# Patient Record
Sex: Male | Born: 1970 | Race: White | Hispanic: No | Marital: Single | Smoking: Current every day smoker
Health system: Southern US, Community
[De-identification: ages and names within clinical notes are randomized; demographics above are authoritative.]

## PROBLEM LIST (undated history)

## (undated) DIAGNOSIS — I2510 Atherosclerotic heart disease of native coronary artery without angina pectoris: Secondary | ICD-10-CM

## (undated) DIAGNOSIS — Z720 Tobacco use: Secondary | ICD-10-CM

## (undated) DIAGNOSIS — I10 Essential (primary) hypertension: Secondary | ICD-10-CM

## (undated) DIAGNOSIS — E785 Hyperlipidemia, unspecified: Secondary | ICD-10-CM

## (undated) HISTORY — DX: Essential (primary) hypertension: I10

## (undated) HISTORY — DX: Hyperlipidemia, unspecified: E78.5

## (undated) HISTORY — DX: Tobacco use: Z72.0

## (undated) HISTORY — DX: Atherosclerotic heart disease of native coronary artery without angina pectoris: I25.10

## (undated) HISTORY — PX: APPENDECTOMY: SHX54

---

## 2007-10-04 ENCOUNTER — Emergency Department: Payer: Self-pay | Admitting: Emergency Medicine

## 2011-05-16 ENCOUNTER — Ambulatory Visit: Payer: Self-pay

## 2012-12-05 ENCOUNTER — Ambulatory Visit: Payer: Self-pay | Admitting: Internal Medicine

## 2012-12-05 LAB — URINALYSIS, COMPLETE
Ketone: NEGATIVE
Nitrite: NEGATIVE
Ph: 6 (ref 4.5–8.0)
Specific Gravity: >=1.03 (ref 1.003–1.030)

## 2012-12-07 LAB — URINE CULTURE

## 2013-11-19 ENCOUNTER — Emergency Department: Payer: Self-pay | Admitting: Emergency Medicine

## 2016-10-14 ENCOUNTER — Emergency Department
Admission: EM | Admit: 2016-10-14 | Discharge: 2016-10-14 | Disposition: A | Discharge status: Home / Self Care | Payer: No Typology Code available for payment source | Attending: Emergency Medicine | Admitting: Emergency Medicine

## 2016-10-14 ENCOUNTER — Emergency Department: Payer: No Typology Code available for payment source

## 2016-10-14 DIAGNOSIS — S060X1A Concussion with loss of consciousness of 30 minutes or less, initial encounter: Secondary | ICD-10-CM | POA: Diagnosis not present

## 2016-10-14 DIAGNOSIS — I10 Essential (primary) hypertension: Secondary | ICD-10-CM | POA: Diagnosis not present

## 2016-10-14 DIAGNOSIS — Y9389 Activity, other specified: Secondary | ICD-10-CM | POA: Insufficient documentation

## 2016-10-14 DIAGNOSIS — M25561 Pain in right knee: Secondary | ICD-10-CM | POA: Diagnosis not present

## 2016-10-14 DIAGNOSIS — Y999 Unspecified external cause status: Secondary | ICD-10-CM | POA: Diagnosis not present

## 2016-10-14 DIAGNOSIS — S0990XA Unspecified injury of head, initial encounter: Secondary | ICD-10-CM | POA: Diagnosis present

## 2016-10-14 DIAGNOSIS — Y92410 Unspecified street and highway as the place of occurrence of the external cause: Secondary | ICD-10-CM | POA: Diagnosis not present

## 2016-10-14 NOTE — ED Notes (Signed)
Pt again declines offer for treatment of hypertension on discharge. Pt states 'i take care of everyone else, I know the drill, I don't need it, i'm stubborn and tough".
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
## 2016-10-14 NOTE — ED Notes (Signed)
Pt ringing call bell to alert rn that md "just came in and told me he's getting my papers together." pt has removed blood pressure cuff.

## 2018-03-26 ENCOUNTER — Emergency Department: Payer: Self-pay

## 2018-03-26 ENCOUNTER — Encounter: Payer: Self-pay | Admitting: Emergency Medicine

## 2018-03-26 ENCOUNTER — Inpatient Hospital Stay
Admission: EM | Admit: 2018-03-26 | Discharge: 2018-03-27 | DRG: 247 | Disposition: A | Discharge status: Home / Self Care | Payer: Self-pay | Attending: Internal Medicine | Admitting: Internal Medicine

## 2018-03-26 ENCOUNTER — Encounter
Admission: EM | Disposition: A | Discharge status: Home / Self Care | Payer: Self-pay | Source: Home / Self Care | Attending: Internal Medicine

## 2018-03-26 ENCOUNTER — Other Ambulatory Visit: Payer: Self-pay

## 2018-03-26 DIAGNOSIS — F17200 Nicotine dependence, unspecified, uncomplicated: Secondary | ICD-10-CM | POA: Diagnosis present

## 2018-03-26 DIAGNOSIS — I214 Non-ST elevation (NSTEMI) myocardial infarction: Principal | ICD-10-CM | POA: Diagnosis present

## 2018-03-26 DIAGNOSIS — Z882 Allergy status to sulfonamides status: Secondary | ICD-10-CM

## 2018-03-26 DIAGNOSIS — F419 Anxiety disorder, unspecified: Secondary | ICD-10-CM | POA: Diagnosis present

## 2018-03-26 DIAGNOSIS — I472 Ventricular tachycardia: Secondary | ICD-10-CM | POA: Diagnosis present

## 2018-03-26 DIAGNOSIS — I160 Hypertensive urgency: Secondary | ICD-10-CM

## 2018-03-26 DIAGNOSIS — I10 Essential (primary) hypertension: Secondary | ICD-10-CM | POA: Diagnosis present

## 2018-03-26 DIAGNOSIS — I200 Unstable angina: Secondary | ICD-10-CM

## 2018-03-26 DIAGNOSIS — I4729 Other ventricular tachycardia: Secondary | ICD-10-CM

## 2018-03-26 DIAGNOSIS — R7989 Other specified abnormal findings of blood chemistry: Secondary | ICD-10-CM

## 2018-03-26 DIAGNOSIS — I25110 Atherosclerotic heart disease of native coronary artery with unstable angina pectoris: Secondary | ICD-10-CM | POA: Diagnosis present

## 2018-03-26 DIAGNOSIS — R778 Other specified abnormalities of plasma proteins: Secondary | ICD-10-CM

## 2018-03-26 DIAGNOSIS — I161 Hypertensive emergency: Secondary | ICD-10-CM | POA: Diagnosis present

## 2018-03-26 DIAGNOSIS — Z8249 Family history of ischemic heart disease and other diseases of the circulatory system: Secondary | ICD-10-CM

## 2018-03-26 DIAGNOSIS — I2510 Atherosclerotic heart disease of native coronary artery without angina pectoris: Secondary | ICD-10-CM

## 2018-03-26 DIAGNOSIS — F17210 Nicotine dependence, cigarettes, uncomplicated: Secondary | ICD-10-CM | POA: Diagnosis present

## 2018-03-26 HISTORY — PX: CORONARY STENT INTERVENTION: CATH118234

## 2018-03-26 HISTORY — PX: LEFT HEART CATH AND CORONARY ANGIOGRAPHY: CATH118249

## 2018-03-26 LAB — BASIC METABOLIC PANEL
Anion gap: 11 (ref 5–15)
BUN: 13 mg/dL (ref 6–20)
CALCIUM: 9.9 mg/dL (ref 8.9–10.3)
CHLORIDE: 99 mmol/L (ref 98–111)
CO2: 26 mmol/L (ref 22–32)
CREATININE: 1.14 mg/dL (ref 0.61–1.24)
GFR calc non Af Amer: >60 mL/min (ref >60)
GLUCOSE: 159 mg/dL — AB (ref 70–99)
Potassium: 3.7 mmol/L (ref 3.5–5.1)
Sodium: 136 mmol/L (ref 135–145)

## 2018-03-26 LAB — GLUCOSE, CAPILLARY: Glucose-Capillary: 108 mg/dL — ABNORMAL HIGH (ref 70–99)

## 2018-03-26 LAB — MRSA PCR SCREENING: MRSA by PCR: NEGATIVE

## 2018-03-26 LAB — HEMOGLOBIN A1C
Hgb A1c MFr Bld: 5.6 % (ref 4.8–5.6)
Mean Plasma Glucose: 114.02 mg/dL

## 2018-03-26 LAB — CBC
HEMATOCRIT: 46.7 % (ref 40.0–52.0)
HEMOGLOBIN: 15.9 g/dL (ref 13.0–18.0)
MCH: 28.3 pg (ref 26.0–34.0)
MCHC: 33.9 g/dL (ref 32.0–36.0)
MCV: 83.3 fL (ref 80.0–100.0)
Platelets: 328 10*3/uL (ref 150–440)
RBC: 5.61 MIL/uL (ref 4.40–5.90)
RDW: 14.7 % — AB (ref 11.5–14.5)
WBC: 13.2 10*3/uL — AB (ref 3.8–10.6)

## 2018-03-26 LAB — TROPONIN I
TROPONIN I: 0.34 ng/mL — AB (ref <0.03)
TROPONIN I: 3.83 ng/mL — AB (ref <0.03)

## 2018-03-26 LAB — PROTIME-INR
INR: 0.96
PROTHROMBIN TIME: 12.7 s (ref 11.4–15.2)

## 2018-03-26 LAB — TSH: TSH: 1.5 u[IU]/mL (ref 0.350–4.500)

## 2018-03-26 LAB — APTT: aPTT: 32 seconds (ref 24–36)

## 2018-03-26 LAB — POCT ACTIVATED CLOTTING TIME: Activated Clotting Time: 324 seconds

## 2018-03-26 SURGERY — LEFT HEART CATH AND CORONARY ANGIOGRAPHY
Anesthesia: Moderate Sedation

## 2018-03-26 MED ORDER — ATORVASTATIN CALCIUM 80 MG PO TABS
80.0000 mg | ORAL_TABLET | Freq: Every day | ORAL | Status: DC
  Administered 2018-03-26: 80 mg via ORAL
  Filled 2018-03-26: qty 1
  Filled 2018-03-26: qty 4
  Filled 2018-03-26: qty 1

## 2018-03-26 MED ORDER — FENTANYL CITRATE (PF) 100 MCG/2ML IJ SOLN
INTRAMUSCULAR | Status: DC | PRN
  Administered 2018-03-26 (×2): 50 ug via INTRAVENOUS

## 2018-03-26 MED ORDER — MIDAZOLAM HCL 2 MG/2ML IJ SOLN
INTRAMUSCULAR | Status: DC | PRN
  Administered 2018-03-26 (×2): 1 mg via INTRAVENOUS

## 2018-03-26 MED ORDER — ONDANSETRON HCL 4 MG/2ML IJ SOLN: 4.0000 mg | Freq: Four times a day (QID) | INTRAMUSCULAR | Status: DC | PRN

## 2018-03-26 MED ORDER — HYDRALAZINE HCL 20 MG/ML IJ SOLN
INTRAMUSCULAR | Status: DC | PRN
  Administered 2018-03-26 (×2): 10 mg via INTRAVENOUS

## 2018-03-26 MED ORDER — HEPARIN (PORCINE) IN NACL 100-0.45 UNIT/ML-% IJ SOLN
1400.0000 [IU]/h | Freq: Once | INTRAMUSCULAR | Status: AC
  Administered 2018-03-26: 1400 [IU]/h via INTRAVENOUS
  Filled 2018-03-26: qty 250

## 2018-03-26 MED ORDER — FENTANYL CITRATE (PF) 100 MCG/2ML IJ SOLN
INTRAMUSCULAR | Status: AC
  Filled 2018-03-26: qty 2

## 2018-03-26 MED ORDER — HYDRALAZINE HCL 20 MG/ML IJ SOLN
10.0000 mg | INTRAMUSCULAR | Status: DC | PRN
  Administered 2018-03-27: 10 mg via INTRAVENOUS
  Filled 2018-03-26: qty 1

## 2018-03-26 MED ORDER — IOPAMIDOL (ISOVUE-300) INJECTION 61%
INTRAVENOUS | Status: DC | PRN
  Administered 2018-03-26: 125 mL via INTRA_ARTERIAL

## 2018-03-26 MED ORDER — SODIUM CHLORIDE 0.9 % IV SOLN
INTRAVENOUS | Status: DC | PRN
  Administered 2018-03-26: 1.75 mg/kg/h via INTRAVENOUS

## 2018-03-26 MED ORDER — NITROGLYCERIN 2 % TD OINT
1.0000 [in_us] | TOPICAL_OINTMENT | Freq: Once | TRANSDERMAL | Status: AC
  Administered 2018-03-26: 1 [in_us] via TOPICAL
  Filled 2018-03-26: qty 1

## 2018-03-26 MED ORDER — NITROGLYCERIN IN D5W 200-5 MCG/ML-% IV SOLN
0.0000 ug/min | INTRAVENOUS | Status: DC
Start: 2018-03-26 — End: 2018-03-27
  Administered 2018-03-26: 25 ug/min via INTRAVENOUS
  Filled 2018-03-26: qty 250

## 2018-03-26 MED ORDER — ZOLPIDEM TARTRATE 5 MG PO TABS: 5.0000 mg | ORAL_TABLET | Freq: Every evening | ORAL | Status: DC | PRN

## 2018-03-26 MED ORDER — HEPARIN SODIUM (PORCINE) 5000 UNIT/ML IJ SOLN
4000.0000 [IU] | Freq: Once | INTRAMUSCULAR | Status: AC
  Administered 2018-03-26: 4000 [IU] via INTRAVENOUS

## 2018-03-26 MED ORDER — BIVALIRUDIN TRIFLUOROACETATE 250 MG IV SOLR
INTRAVENOUS | Status: AC
  Filled 2018-03-26: qty 250

## 2018-03-26 MED ORDER — DOCUSATE SODIUM 100 MG PO CAPS
100.0000 mg | ORAL_CAPSULE | Freq: Two times a day (BID) | ORAL | Status: DC
  Administered 2018-03-26 – 2018-03-27 (×3): 100 mg via ORAL
  Filled 2018-03-26 (×3): qty 1

## 2018-03-26 MED ORDER — ASPIRIN 81 MG PO CHEW
324.0000 mg | CHEWABLE_TABLET | Freq: Once | ORAL | Status: AC
  Administered 2018-03-26: 324 mg via ORAL
  Filled 2018-03-26: qty 4

## 2018-03-26 MED ORDER — HYDRALAZINE HCL 20 MG/ML IJ SOLN: 5.0000 mg | INTRAMUSCULAR | Status: AC | PRN

## 2018-03-26 MED ORDER — ONDANSETRON HCL 4 MG/2ML IJ SOLN
INTRAMUSCULAR | Status: AC
  Filled 2018-03-26: qty 2

## 2018-03-26 MED ORDER — ENALAPRIL MALEATE 10 MG PO TABS
10.0000 mg | ORAL_TABLET | Freq: Every day | ORAL | Status: DC
Start: 2018-03-26 — End: 2018-03-27
  Administered 2018-03-26 – 2018-03-27 (×2): 10 mg via ORAL
  Filled 2018-03-26 (×2): qty 1

## 2018-03-26 MED ORDER — NICOTINE 21 MG/24HR TD PT24
21.0000 mg | MEDICATED_PATCH | Freq: Every day | TRANSDERMAL | Status: DC
  Administered 2018-03-26 – 2018-03-27 (×2): 21 mg via TRANSDERMAL
  Filled 2018-03-26 (×2): qty 1

## 2018-03-26 MED ORDER — BIVALIRUDIN BOLUS VIA INFUSION - CUPID
INTRAVENOUS | Status: DC | PRN
  Administered 2018-03-26: 90.9 mg via INTRAVENOUS

## 2018-03-26 MED ORDER — BIVALIRUDIN TRIFLUOROACETATE 250 MG IV SOLR
INTRAVENOUS | Status: AC
Start: 2018-03-26 — End: ?
  Filled 2018-03-26: qty 250

## 2018-03-26 MED ORDER — LABETALOL HCL 5 MG/ML IV SOLN: 10.0000 mg | INTRAVENOUS | Status: AC | PRN

## 2018-03-26 MED ORDER — ASPIRIN EC 81 MG PO TBEC
81.0000 mg | DELAYED_RELEASE_TABLET | Freq: Every day | ORAL | Status: DC
  Administered 2018-03-26 – 2018-03-27 (×2): 81 mg via ORAL
  Filled 2018-03-26 (×2): qty 1

## 2018-03-26 MED ORDER — SODIUM CHLORIDE 0.9% FLUSH: 3.0000 mL | INTRAVENOUS | Status: DC | PRN

## 2018-03-26 MED ORDER — NITROGLYCERIN 5 MG/ML IV SOLN
INTRAVENOUS | Status: AC
  Filled 2018-03-26: qty 10

## 2018-03-26 MED ORDER — SODIUM CHLORIDE 0.9 % WEIGHT BASED INFUSION: 3.0000 mL/kg/h | INTRAVENOUS | Status: AC

## 2018-03-26 MED ORDER — ATORVASTATIN CALCIUM 20 MG PO TABS: 40.0000 mg | ORAL_TABLET | Freq: Every day | ORAL | Status: DC

## 2018-03-26 MED ORDER — HYDRALAZINE HCL 20 MG/ML IJ SOLN
INTRAMUSCULAR | Status: AC
Start: 2018-03-26 — End: ?
  Filled 2018-03-26: qty 1

## 2018-03-26 MED ORDER — SODIUM CHLORIDE 0.9% FLUSH
3.0000 mL | Freq: Two times a day (BID) | INTRAVENOUS | Status: DC
  Administered 2018-03-27 (×2): 3 mL via INTRAVENOUS

## 2018-03-26 MED ORDER — HEPARIN (PORCINE) IN NACL 100-0.45 UNIT/ML-% IJ SOLN
1400.0000 [IU]/h | INTRAMUSCULAR | Status: DC
  Administered 2018-03-26: 1400 [IU]/h via INTRAVENOUS

## 2018-03-26 MED ORDER — ALPRAZOLAM 0.25 MG PO TABS: 0.2500 mg | ORAL_TABLET | Freq: Two times a day (BID) | ORAL | Status: DC | PRN

## 2018-03-26 MED ORDER — CLOPIDOGREL BISULFATE 75 MG PO TABS
75.0000 mg | ORAL_TABLET | Freq: Every day | ORAL | Status: DC
  Administered 2018-03-27: 75 mg via ORAL
  Filled 2018-03-26: qty 1

## 2018-03-26 MED ORDER — METOPROLOL TARTRATE 25 MG PO TABS
12.5000 mg | ORAL_TABLET | Freq: Two times a day (BID) | ORAL | Status: DC
Start: 2018-03-26 — End: 2018-03-27
  Administered 2018-03-26 – 2018-03-27 (×3): 12.5 mg via ORAL
  Filled 2018-03-26 (×3): qty 1

## 2018-03-26 MED ORDER — ONDANSETRON HCL 4 MG PO TABS: 4.0000 mg | ORAL_TABLET | Freq: Four times a day (QID) | ORAL | Status: DC | PRN

## 2018-03-26 MED ORDER — NITROGLYCERIN IN D5W 200-5 MCG/ML-% IV SOLN
40.0000 ug/min | Freq: Once | INTRAVENOUS | Status: AC
  Administered 2018-03-26: 40 ug/min via INTRAVENOUS
  Filled 2018-03-26: qty 250

## 2018-03-26 MED ORDER — NITROGLYCERIN 1 MG/10 ML FOR IR/CATH LAB
INTRA_ARTERIAL | Status: DC | PRN
  Administered 2018-03-26: 300 ug via INTRACORONARY

## 2018-03-26 MED ORDER — SODIUM CHLORIDE 0.9 % IV BOLUS
500.0000 mL | Freq: Once | INTRAVENOUS | Status: AC
  Administered 2018-03-26: 500 mL via INTRAVENOUS

## 2018-03-26 MED ORDER — ACETAMINOPHEN 325 MG PO TABS
650.0000 mg | ORAL_TABLET | Freq: Four times a day (QID) | ORAL | Status: DC | PRN
  Administered 2018-03-26 – 2018-03-27 (×2): 650 mg via ORAL
  Filled 2018-03-26 (×2): qty 2

## 2018-03-26 MED ORDER — IOPAMIDOL (ISOVUE-300) INJECTION 61%
INTRAVENOUS | Status: DC | PRN
  Administered 2018-03-26: 145 mL via INTRA_ARTERIAL

## 2018-03-26 MED ORDER — SODIUM CHLORIDE 0.9 % WEIGHT BASED INFUSION
1.0000 mL/kg/h | INTRAVENOUS | Status: AC
  Administered 2018-03-26 (×2): 1 mL/kg/h via INTRAVENOUS

## 2018-03-26 MED ORDER — NITROGLYCERIN IN D5W 200-5 MCG/ML-% IV SOLN
40.0000 ug/min | Freq: Once | INTRAVENOUS | Status: AC
  Administered 2018-03-26: 20 ug/min via INTRAVENOUS

## 2018-03-26 MED ORDER — HEPARIN (PORCINE) IN NACL 100-0.45 UNIT/ML-% IJ SOLN: 14.0000 [IU]/kg/h | Freq: Once | INTRAMUSCULAR | Status: DC

## 2018-03-26 MED ORDER — ACETAMINOPHEN 650 MG RE SUPP
650.0000 mg | Freq: Four times a day (QID) | RECTAL | Status: DC | PRN
Start: 2018-03-26 — End: 2018-03-27

## 2018-03-26 MED ORDER — GI COCKTAIL ~~LOC~~
30.0000 mL | Freq: Once | ORAL | Status: AC
  Administered 2018-03-26: 30 mL via ORAL
  Filled 2018-03-26: qty 30

## 2018-03-26 MED ORDER — CLOPIDOGREL BISULFATE 300 MG PO TABS
ORAL_TABLET | ORAL | Status: AC
  Filled 2018-03-26: qty 2

## 2018-03-26 MED ORDER — LIDOCAINE HCL (PF) 1 % IJ SOLN
INTRAMUSCULAR | Status: AC
  Filled 2018-03-26: qty 30

## 2018-03-26 MED ORDER — CLOPIDOGREL BISULFATE 75 MG PO TABS
ORAL_TABLET | ORAL | Status: DC | PRN
  Administered 2018-03-26: 600 mg via ORAL

## 2018-03-26 MED ORDER — ASPIRIN 81 MG PO CHEW: 81.0000 mg | CHEWABLE_TABLET | ORAL | Status: DC

## 2018-03-26 MED ORDER — SODIUM CHLORIDE 0.9 % WEIGHT BASED INFUSION: 1.0000 mL/kg/h | INTRAVENOUS | Status: DC

## 2018-03-26 MED ORDER — ACETAMINOPHEN 325 MG PO TABS: 650.0000 mg | ORAL_TABLET | ORAL | Status: DC | PRN

## 2018-03-26 MED ORDER — ASPIRIN 81 MG PO CHEW: 81.0000 mg | CHEWABLE_TABLET | Freq: Every day | ORAL | Status: DC

## 2018-03-26 MED ORDER — SODIUM CHLORIDE 0.9 % IV SOLN
INTRAVENOUS | Status: AC | PRN
  Administered 2018-03-26: 0.25 mg/kg/h via INTRAVENOUS

## 2018-03-26 MED ORDER — SODIUM CHLORIDE 0.9 % IV SOLN: 250.0000 mL | INTRAVENOUS | Status: DC | PRN

## 2018-03-26 MED ORDER — ONDANSETRON HCL 4 MG/2ML IJ SOLN
INTRAMUSCULAR | Status: DC | PRN
  Administered 2018-03-26: 4 mg via INTRAVENOUS

## 2018-03-26 MED ORDER — SODIUM CHLORIDE 0.9 % IV SOLN
INTRAVENOUS | Status: DC
  Administered 2018-03-26: 12:00:00 via INTRAVENOUS

## 2018-03-26 MED ORDER — MIDAZOLAM HCL 2 MG/2ML IJ SOLN
INTRAMUSCULAR | Status: AC
  Filled 2018-03-26: qty 2

## 2018-03-26 SURGICAL SUPPLY — 16 items
BALLN TREK RX 2.25X12 (BALLOONS) ×3
BALLOON TREK RX 2.25X12 (BALLOONS) ×1 IMPLANT
CATH INFINITI 5FR ANG PIGTAIL (CATHETERS) ×3 IMPLANT
CATH INFINITI 5FR JL4 (CATHETERS) ×3 IMPLANT
CATH INFINITI JR4 5F (CATHETERS) ×3 IMPLANT
CATH VISTA GUIDE 6FR JR4 (CATHETERS) ×3 IMPLANT
DEVICE CLOSURE MYNXGRIP 6/7F (Vascular Products) ×3 IMPLANT
DEVICE INFLAT 30 PLUS (MISCELLANEOUS) ×3 IMPLANT
KIT MANI 3VAL PERCEP (MISCELLANEOUS) ×3 IMPLANT
NEEDLE PERC 18GX7CM (NEEDLE) ×3 IMPLANT
PACK CARDIAC CATH (CUSTOM PROCEDURE TRAY) ×3 IMPLANT
SHEATH AVANTI 5FR X 11CM (SHEATH) ×3 IMPLANT
SHEATH AVANTI 6FR X 11CM (SHEATH) ×3 IMPLANT
STENT RESOLUTE ONYX 2.5X12 (Permanent Stent) ×3 IMPLANT
WIRE ASAHI PROWATER 180CM (WIRE) ×3 IMPLANT
WIRE GUIDERIGHT .035X150 (WIRE) ×3 IMPLANT

## 2018-03-26 NOTE — Progress Notes (Signed)
Pt to cath lab.

## 2018-03-26 NOTE — Progress Notes (Signed)
ANTICOAGULATION CONSULT NOTE - Initial Consult  Pharmacy Consult for heparin drip Indication: chest pain/ACS  Allergies  Allergen Reactions  . Sulfa Antibiotics Anaphylaxis    Patient Measurements: Height: 6\' 2"  (188 cm) Weight: 270 lb (122.5 kg) IBW/kg (Calculated) : 82.2 Heparin Dosing Weight: 109 kg  Vital Signs: Temp: 97.7 F (36.5 C) (07/29 0434) Temp Source: Oral (07/29 0434) BP: 226/127 (07/29 0500) Pulse Rate: 67 (07/29 0515)  Labs: Recent Labs    03/26/18 0453  HGB 15.9  HCT 46.7  PLT 328  CREATININE 1.14  TROPONINI 0.34*    Estimated Creatinine Clearance: 111.4 mL/min (by C-G formula based on SCr of 1.14 mg/dL).   Medical History: Past Medical History:  Diagnosis Date  . Hypertension     Medications:  No anticoag in PTA meds.  Assessment: Trop 0.34  Goal of Therapy:  Heparin level 0.3-0.7 units/ml Monitor platelets by anticoagulation protocol: Yes   Plan:  4000 unit bolus and initial rate of 1400 units/hr. First heparin level 6 hours after start of infusion.    Erasmo Vertz S 03/26/2018,5:42 AM

## 2018-03-26 NOTE — ED Notes (Signed)
Pt states feeling improved after reduction in ntg drip. Pt states sensation with sweating he was feeling earlier is gone.

## 2018-03-26 NOTE — ED Notes (Signed)
Report to amber, rn.  

## 2018-03-26 NOTE — ED Triage Notes (Signed)
Pt reports left sided chest pain since 7pm last night; had similar pain that eased after taking TUMS; pt says pain is intermittent squeezing and pressure pain; non radiating; denies shortness of breath, nausea; some diaphoresis; pt admits to "severe" hypertension; pt ambulatory with steady gait; talking in complete coherent sentences

## 2018-03-26 NOTE — ED Notes (Signed)
Pt states "i'm beginning to sweat and not feel good". Order for reduction in ntg drip received with another ekg and ns bolus.

## 2018-03-26 NOTE — H&P (Signed)
Nathaniel Chandler is an 47 y.o. male.   Chief Complaint: Chest pain HPI: The patient with past medical history of hypertension presents to the emergency department complaining of chest pain.  The pain began at rest and is located over his left chest.  Pain radiates into his shoulders bilaterally and into his neck.  The patient admits to diaphoresis but denies nausea, vomiting or shortness of breath.  His blood pressure was greater than 200/100 upon arrival. The patient was placed on a nitro drip. Troponin level was elevated and the patient has some inversion of T waves in leads III and aVF which prompted initiation of a heparin drip prior to the emergency department staff calling the hospitalist service for admission.  Past Medical History:  Diagnosis Date  . Hypertension     Past Surgical History:  Procedure Laterality Date  . APPENDECTOMY      Family History  Problem Relation Age of Onset  . Diabetes Mellitus II Father   . Hypertension Father   . Congestive Heart Failure Father   . CAD Brother        younger   Social History:  reports that he has been smoking cigarettes.  He has been smoking about 2.00 packs per day. He has never used smokeless tobacco. He reports that he has current or past drug history. Drug: Marijuana. He reports that he does not drink alcohol.  Allergies:  Allergies  Allergen Reactions  . Sulfa Antibiotics Anaphylaxis    Prior to Admission medications   Medication Sig Start Date End Date Taking? Authorizing Provider  NON FORMULARY Take 1 Dose by mouth daily. BC Sinus Pain & Congestion   Yes [provider]     Results for orders placed or performed during the hospital encounter of 03/26/18 (from the past 48 hour(s))  Basic metabolic panel     Status: Abnormal   Collection Time: 03/26/18  4:53 AM  Result Value Ref Range   Sodium 136 135 - 145 mmol/L   Potassium 3.7 3.5 - 5.1 mmol/L   Chloride 99 98 - 111 mmol/L   CO2 26 22 - 32 mmol/L   Glucose,  Bld 159 (H) 70 - 99 mg/dL   BUN 13 6 - 20 mg/dL   Creatinine, Ser 1.14 0.61 - 1.24 mg/dL   Calcium 9.9 8.9 - 10.3 mg/dL   GFR calc non Af Amer >60 >60 mL/min   GFR calc Af Amer >60 >60 mL/min    Comment: (NOTE) The eGFR has been calculated using the CKD EPI equation. This calculation has not been validated in all clinical situations. eGFR's persistently <60 mL/min signify possible Chronic Kidney Disease.    Anion gap 11 5 - 15    Comment: Performed at [facility], [address] [place], [place] [address]  CBC     Status: Abnormal   Collection Time: 03/26/18  4:53 AM  Result Value Ref Range   WBC 13.2 (H) 3.8 - 10.6 K/uL   RBC 5.61 4.40 - 5.90 MIL/uL   Hemoglobin 15.9 13.0 - 18.0 g/dL   HCT 46.7 40.0 - 52.0 %   MCV 83.3 80.0 - 100.0 fL   MCH 28.3 26.0 - 34.0 pg   MCHC 33.9 32.0 - 36.0 g/dL   RDW 14.7 (H) 11.5 - 14.5 %   Platelets 328 150 - 440 K/uL    Comment: Performed at [facility], [address] [place],  [address]  Troponin I     Status:  Abnormal   Collection Time: 03/26/18  4:53 AM  Result Value Ref Range   Troponin I 0.34 (HH) <0.03 ng/mL    Comment: CRITICAL RESULT CALLED TO, READ BACK BY AND VERIFIED WITH APRIL BRUMGARD AT 6195 03/26/18.PMH Performed at [facility], [address] [place], [place] [address]   Protime-INR     Status: None   Collection Time: 03/26/18  4:53 AM  Result Value Ref Range   Prothrombin Time 12.7 11.4 - 15.2 seconds   INR 0.96     Comment: Performed at [facility], [address] [place], [place] [address]  APTT     Status: None   Collection Time: 03/26/18  4:53 AM  Result Value Ref Range   aPTT 32 24 - 36 seconds    Comment: Performed at [facility], [address] [place], [place] [address]   Dg Chest Port 1 View  Result Date: 03/26/2018 CLINICAL DATA:  LEFT chest pain beginning at 7 p.m. History of hypertension. EXAM: PORTABLE CHEST 1 VIEW COMPARISON:  None.  FINDINGS: Cardiac silhouette is mildly enlarged. Mediastinal silhouette is not suspicious. Blunting of the LEFT costophrenic angle with LEFT lung base strandy densities. No pneumothorax. Soft tissue planes and included osseous structures are non suspicious. IMPRESSION: 1. Small LEFT pleural effusion versus pleural thickening with LEFT lung base atelectasis/scarring. 2. Mild cardiomegaly. Electronically Signed   By: Elon Alas M.D.   On: 03/26/2018 06:08    Review of Systems  Constitutional: Positive for diaphoresis. Negative for chills and fever.  HENT: Negative for sore throat and tinnitus.   Eyes: Negative for blurred vision and redness.  Respiratory: Negative for cough and shortness of breath.   Cardiovascular: Positive for chest pain. Negative for palpitations, orthopnea and PND.  Gastrointestinal: Negative for abdominal pain, diarrhea, nausea and vomiting.  Genitourinary: Negative for dysuria, frequency and urgency.  Musculoskeletal: Negative for joint pain and myalgias.  Skin: Negative for rash.       No lesions  Neurological: Negative for speech change, focal weakness and weakness.  Endo/Heme/Allergies: Does not bruise/bleed easily.       No temperature intolerance  Psychiatric/Behavioral: Negative for depression and suicidal ideas.    Blood pressure (!) 158/100, pulse 66, temperature 97.7 F (36.5 C), temperature source Oral, resp. rate 17, height _0  (1.88 m), weight 122.5 kg (270 lb), SpO2 98 %. Physical Exam  Vitals reviewed. Constitutional: He is oriented to person, place, and time. He appears well-developed and well-nourished. No distress.  HENT:  Head: Normocephalic and atraumatic.  Mouth/Throat: Oropharynx is clear and moist.  Eyes: Pupils are equal, round, and reactive to light. Conjunctivae and EOM are normal. No scleral icterus.  Neck: Normal range of motion. Neck supple. No JVD present. No tracheal deviation present. No thyromegaly present.  Cardiovascular:  Normal rate, regular rhythm and normal heart sounds. Exam reveals no gallop and no friction rub.  No murmur heard. Respiratory: Effort normal and breath sounds normal. No respiratory distress.  GI: Soft. Bowel sounds are normal. He exhibits no distension. There is no tenderness.  Genitourinary:  Genitourinary Comments: Deferred  Musculoskeletal: Normal range of motion. He exhibits no edema.  Neurological: He is alert and oriented to person, place, and time. No cranial nerve deficit.  Skin: Skin is warm and dry. No rash noted. No erythema.  Psychiatric: He has a normal mood and affect. His behavior is normal. Judgment and thought content normal.     Assessment/Plan This is a 47 year old male  admitted for NSTEMI. 1.  NSTEMI: Elevated troponin, ongoing chest pain and T wave inversions.  Continue heparin drip.  Consult cardiology.  Monitor telemetry.  The patient is n.p.o. in anticipation of cardiac catheterization. 2.  Hypertensive emergency: Continue nitro drip.  The patient will need to be started on antihypertensive medication although he is reluctant to take any meds. 3.  Leukocytosis: Stress demargination.  No indication of infectious etiology. 4.  DVT prophylaxis: Full dose anticoagulation Exline 5.  GI prophylaxis: None The patient is a full code.  Time spent on admission orders and critical care approximately 45 minutes.  Harrie Foreman, MD 03/26/2018, 7:14 AM

## 2018-03-26 NOTE — Consult Note (Signed)
Reason for Consult:   NSTEMI  Requesting Physician: Dr Amado CoeGouru Primary Cardiologist Dr Mariah MillingGollan  Asked to see this pleasant 47 y/o male by Dr Cheron SchaumannGouro for chest pain with ERKG changes and elevated Troponin.   HPI:  47 y/o Caucasian male, works at a [employer],  with no history of HTN, HLD, or DM- but doesn't go to doctors-admitted this am with SSCP. He is a 2 PPD smoker and has a strong family history of CAD.  EKG shows TWI in 3, AVF and troponin 0.34. The pt says he developed SSCP last evening associated with diaphoresis and Lt arm pain. He had similar symptoms last week that abated after antacids. Last PM his symptoms didn't resolve after antacids and hydration and he came to the ED. He is currently pain free.    PMHx:  Past Medical History:  Diagnosis Date  . Hypertension     Past Surgical History:  Procedure Laterality Date  . APPENDECTOMY      SOCHx:  reports that he has been smoking cigarettes.  He has been smoking about 2.00 packs per day. He has never used smokeless tobacco. He reports that he has current or past drug history. Drug: Marijuana. He reports that he does not drink alcohol.  FAMHx: Family History  Problem Relation Age of Onset  . Diabetes Mellitus II Father   . Hypertension Father   . Congestive Heart Failure Father   . CAD Brother        younger    ALLERGIES: Allergies  Allergen Reactions  . Sulfa Antibiotics Anaphylaxis    ROS: Review of Systems: General: negative for chills, fever, night sweats or weight changes.  Cardiovascular: negative for dyspnea on exertion, edema, orthopnea, palpitations, paroxysmal nocturnal dyspnea or shortness of breath HEENT: negative for any visual disturbances, blindness, glaucoma Dermatological: negative for rash Respiratory: negative for cough, hemoptysis, or wheezing Urologic: negative for hematuria or dysuria Abdominal: negative for nausea, vomiting, diarrhea, bright red blood per rectum,  melena, or hematemesis Neurologic: negative for visual changes, syncope, or dizziness Musculoskeletal: negative for back pain, joint pain, or swelling Psych: cooperative and appropriate All other systems reviewed and are otherwise negative except as noted above.   HOME MEDICATIONS: Prior to Admission medications   Medication Sig Start Date End Date Taking? Authorizing Provider  NON FORMULARY Take 1 Dose by mouth daily. BC Sinus Pain & Congestion   Yes [provider]    HOSPITAL MEDICATIONS: I have reviewed the patient's current medications.  VITALS: Blood pressure (!) 205/115, pulse (!) 56, temperature 97.6 F (36.4 C), temperature source Oral, resp. rate 15, height 6\' 2"  (1.88 m), weight 267 lb 4.8 oz (121.2 kg), SpO2 100 %.  PHYSICAL EXAM: General appearance: alert, cooperative and no distress Neck: no carotid bruit and no JVD Lungs: clear to auscultation bilaterally Heart: regular rate and rhythm Abdomen: soft, non-tender; bowel sounds normal; no masses,  no organomegaly Extremities: extremities normal, atraumatic, no cyanosis or edema Pulses: 2+ and symmetric Skin: Skin color, texture, turgor normal. No rashes or lesions Neurologic: Grossly normal  LABS: Results for orders placed or performed during the hospital encounter of 03/26/18 (from the past 24 hour(s))  Basic metabolic panel     Status: Abnormal   Collection Time: 03/26/18  4:53 AM  Result Value Ref Range   Sodium 136 135 - 145 mmol/L   Potassium 3.7 3.5 - 5.1 mmol/L   Chloride 99 98 - 111 mmol/L  CO2 26 22 - 32 mmol/L   Glucose, Bld 159 (H) 70 - 99 mg/dL   BUN 13 6 - 20 mg/dL   Creatinine, Ser 7.25 0.61 - 1.24 mg/dL   Calcium 9.9 8.9 - 36.6 mg/dL   GFR calc non Af Amer >60 >60 mL/min   GFR calc Af Amer >60 >60 mL/min   Anion gap 11 5 - 15  CBC     Status: Abnormal   Collection Time: 03/26/18  4:53 AM  Result Value Ref Range   WBC 13.2 (H) 3.8 - 10.6 K/uL   RBC 5.61 4.40 - 5.90 MIL/uL    Hemoglobin 15.9 13.0 - 18.0 g/dL   HCT 44.0 34.7 - 42.5 %   MCV 83.3 80.0 - 100.0 fL   MCH 28.3 26.0 - 34.0 pg   MCHC 33.9 32.0 - 36.0 g/dL   RDW 95.6 (H) 38.7 - 56.4 %   Platelets 328 150 - 440 K/uL  Troponin I     Status: Abnormal   Collection Time: 03/26/18  4:53 AM  Result Value Ref Range   Troponin I 0.34 (HH) <0.03 ng/mL  Protime-INR     Status: None   Collection Time: 03/26/18  4:53 AM  Result Value Ref Range   Prothrombin Time 12.7 11.4 - 15.2 seconds   INR 0.96   APTT     Status: None   Collection Time: 03/26/18  4:53 AM  Result Value Ref Range   aPTT 32 24 - 36 seconds  TSH     Status: None   Collection Time: 03/26/18  4:53 AM  Result Value Ref Range   TSH 1.500 0.350 - 4.500 uIU/mL  Glucose, capillary     Status: Abnormal   Collection Time: 03/26/18  7:41 AM  Result Value Ref Range   Glucose-Capillary 108 (H) 70 - 99 mg/dL  MRSA PCR Screening     Status: None   Collection Time: 03/26/18  7:43 AM  Result Value Ref Range   MRSA by PCR NEGATIVE NEGATIVE    EKG: NSR, TWI lead 3, AVF  IMAGING: Dg Chest Port 1 View  Result Date: 03/26/2018 CLINICAL DATA:  LEFT chest pain beginning at 7 p.m. History of hypertension. EXAM: PORTABLE CHEST 1 VIEW COMPARISON:  None. FINDINGS: Cardiac silhouette is mildly enlarged. Mediastinal silhouette is not suspicious. Blunting of the LEFT costophrenic angle with LEFT lung base strandy densities. No pneumothorax. Soft tissue planes and included osseous structures are non suspicious. IMPRESSION: 1. Small LEFT pleural effusion versus pleural thickening with LEFT lung base atelectasis/scarring. 2. Mild cardiomegaly. Electronically Signed   By: Awilda Metro M.D.   On: 03/26/2018 06:08    IMPRESSION: Principal Problem:   NSTEMI (non-ST elevated myocardial infarction) [facility]) Active Problems:   Uncontrolled hypertension   Smoker   Family history of coronary artery disease   NSVT (nonsustained ventricular tachycardia)  (HCC)   RECOMMENDATION: Cath today. Hydralazine PRN for B/P, increase Lipitor to 80 mg, low dose beta blocker, Nicoderm patch. The patient understands that risks included but are not limited to stroke (1 in 1000), death (1 in 1000), kidney failure [usually temporary] (1 in 500), bleeding (1 in 200), allergic reaction [possibly serious] (1 in 200).  The patient understands and agrees to proceed.    Time Spent Directly with Patient: 45 minutes  Corine Shelter, [place]  [phone] beeper 03/26/2018, 10:36 AM

## 2018-03-26 NOTE — Consult Note (Signed)
[facility] Pulmonary Medicine Consultation      Name: Nathaniel Chandler MRN: [id number] DOB: [birth year]    ADMISSION DATE:  03/26/2018 CONSULTATION DATE:  03/26/2018  REFERRING MD :  DIAMOND   CHIEF COMPLAINT:   Chest pain   HISTORY OF PRESENT ILLNESS   47 year old male who presented to the emergency department for evaluation of chest pain. Patient states he had finished doing some yard work yesterday when he had a sudden onset of chest pain. He describes it as indigestion with associated diaphoresis. He describes a "squeezing pain". He came for evaluation after he "could not get comfortable". Patient states he had an episode of chest pain one week ago that was similar. He took chewable antacids which provided relief. He endorses he has a history of hypertension, but states he does not like medications and does not see a doctor. This morning he states his chest pain is better and is endorsing a headache that started with the nitro. He denies fever/chills, SOB, N/V. He has a history of tinnitus that is at baseline. He endorses a significant family history of heart disease.  This morning the patient is very concerned about how he will afford his care in the hospital. He states he does not have insurance and would like case management to see him.    SIGNIFICANT EVENTS   Patient with elevated troponin (0.34) and inferior lead t-wave inversions in ED. Patient placed on nitro and heparin drip and transported to ICU.     PAST MEDICAL HISTORY    :  Past Medical History:  Diagnosis Date  . Hypertension    Past Surgical History:  Procedure Laterality Date  . APPENDECTOMY     Prior to Admission medications   Medication Sig Start Date End Date Taking? Authorizing Provider  NON FORMULARY Take 1 Dose by mouth daily. BC Sinus Pain & Congestion   Yes [provider]   Allergies  Allergen Reactions  . Sulfa Antibiotics Anaphylaxis     FAMILY HISTORY   Family History    Problem Relation Age of Onset  . Diabetes Mellitus II Father   . Hypertension Father   . Congestive Heart Failure Father   . CAD Brother        younger      SOCIAL HISTORY    reports that he has been smoking cigarettes.  He has been smoking about 2.00 packs per day. He has never used smokeless tobacco. He reports that he has current or past drug history. Drug: Marijuana. He reports that he does not drink alcohol.  Review of Systems  Constitutional: Positive for diaphoresis. Negative for chills and fever.  HENT: Positive for tinnitus. Negative for congestion, ear pain, sinus pain and sore throat.   Eyes: Negative for blurred vision, double vision and pain.  Respiratory: Negative for cough, sputum production, shortness of breath and wheezing.   Cardiovascular: Positive for chest pain. Negative for palpitations, orthopnea, claudication, leg swelling and PND.  Gastrointestinal: Negative for abdominal pain, constipation, diarrhea, heartburn, nausea and vomiting.  Genitourinary: Negative for dysuria, frequency and urgency.  Musculoskeletal: Positive for neck pain. Negative for back pain and myalgias.  Skin: Negative for rash.  Neurological: Positive for headaches. Negative for dizziness and weakness.      VITAL SIGNS    Temp:  [97.6 F (36.4 C)-97.7 F (36.5 C)] 97.6 F (36.4 C) (07/29 0735) Pulse Rate:  [56-78] 56 (07/29 0735) Resp:  [9-24] 15 (07/29 0735) BP: (144-249)/(90-131) 205/115 (07/29  0735) SpO2:  [97 %-100 %] 100 % (07/29 0735) Weight:  [121.2 kg (267 lb 4.8 oz)-122.5 kg (270 lb)] 121.2 kg (267 lb 4.8 oz) (07/29 0735)    INTAKE / OUTPUT: No intake or output data in the 24 hours ending 03/26/18 0922     PHYSICAL EXAM   Physical Exam  Constitutional: He is oriented to person, place, and time. He appears well-developed and well-nourished. No distress.  HENT:  Head: Normocephalic and atraumatic.  Eyes: Pupils are equal, round, and reactive to light. EOM are  normal.  Neck: Normal range of motion. Neck supple.  Cardiovascular:  No tenderness to palpation of chest wall. Regular rate and rhythm. Normal S1 and S2. No murmurs, rubs, gallops. 2+ radial and dorsalis pedis pulses bilaterally.   Pulmonary/Chest:  Clear to auscultation bilaterally. No wheezes, rales, or rhonchi.   Abdominal: Soft. Bowel sounds are normal. He exhibits no distension. There is no tenderness.  Musculoskeletal:       Right lower leg: He exhibits no edema.       Left lower leg: He exhibits no edema.  Lymphadenopathy:    He has no cervical adenopathy.  Neurological: He is alert and oriented to person, place, and time.  Skin: Skin is warm and dry. Capillary refill takes less than 2 seconds. Ecchymosis (left lower leg) noted.  Psychiatric: He has a normal mood and affect.       LABS   LABS:  CBC Recent Labs  Lab 03/26/18 0453  WBC 13.2*  HGB 15.9  HCT 46.7  PLT 328   Coag's Recent Labs  Lab 03/26/18 0453  APTT 32  INR 0.96   BMET Recent Labs  Lab 03/26/18 0453  NA 136  K 3.7  CL 99  CO2 26  BUN 13  CREATININE 1.14  GLUCOSE 159*   Electrolytes Recent Labs  Lab 03/26/18 0453  CALCIUM 9.9   Sepsis Markers No results for input(s): LATICACIDVEN, PROCALCITON, O2SATVEN in the last 168 hours. ABG No results for input(s): PHART, PCO2ART, PO2ART in the last 168 hours. Liver Enzymes No results for input(s): AST, ALT, ALKPHOS, BILITOT, ALBUMIN in the last 168 hours. Cardiac Enzymes Recent Labs  Lab 03/26/18 0453  TROPONINI 0.34*   Glucose Recent Labs  Lab 03/26/18 0741  GLUCAP 108*     Recent Results (from the past 240 hour(s))  MRSA PCR Screening     Status: None   Collection Time: 03/26/18  7:43 AM  Result Value Ref Range Status   MRSA by PCR NEGATIVE NEGATIVE Final    Comment:        The GeneXpert MRSA Assay (FDA approved for NASAL specimens only), is one component of a comprehensive MRSA colonization surveillance program. It  is not intended to diagnose MRSA infection nor to guide or monitor treatment for MRSA infections. Performed at [facility], [address] [place], [place] [address]      Current Facility-Administered Medications:  .  acetaminophen (TYLENOL) tablet 650 mg, 650 mg, Oral, Q6H PRN **OR** acetaminophen (TYLENOL) suppository 650 mg, 650 mg, Rectal, Q6H PRN, Arnaldo Natal, MD .  aspirin EC tablet 81 mg, 81 mg, Oral, Daily, Arnaldo Natal, MD .  docusate sodium (COLACE) capsule 100 mg, 100 mg, Oral, BID, Arnaldo Natal, MD .  ondansetron [facility]) tablet 4 mg, 4 mg, Oral, Q6H PRN **OR** ondansetron (ZOFRAN) injection 4 mg, 4 mg, Intravenous, Q6H PRN, Arnaldo Natal, MD  IMAGING    Dg Chest [facility]  1 View  Result Date: 03/26/2018 CLINICAL DATA:  LEFT chest pain beginning at 7 p.m. History of hypertension. EXAM: PORTABLE CHEST 1 VIEW COMPARISON:  None. FINDINGS: Cardiac silhouette is mildly enlarged. Mediastinal silhouette is not suspicious. Blunting of the LEFT costophrenic angle with LEFT lung base strandy densities. No pneumothorax. Soft tissue planes and included osseous structures are non suspicious. IMPRESSION: 1. Small LEFT pleural effusion versus pleural thickening with LEFT lung base atelectasis/scarring. 2. Mild cardiomegaly. Electronically Signed   By: Awilda Metro M.D.   On: 03/26/2018 06:08    MICRO DATA: MRSA PCR: negative   ANTIMICROBIALS: no indication    ASSESSMENT/PLAN   47 year old male with history of hypertension who presents to the emergency department for evaluation of chest pain. Patient with elevated troponin (0.34) and EKG with inferior t wave inversion concerning for NSTEMI. Patient with blood pressure >220/100 in emergency room as well. Patient started on nitro and heparin drip. Cardiology consulted. Patient expresses he is concerned about how much this stay is going to cost along with how he does not like to take medication.      CARDIOVASCULAR A: NSTEMI with hypertensive emergency P: Heparin and nitro drip started, continue -aspirin prescribed -blood pressure still elevated, but improving. Will need antihypertensive medications (ACEI/ARB +/-CCB, thiazide) -cardiology consult ordered -will need statin/cholesterol panel    PULMONARY A: No current concerns, CXR with left atelectasis vs. 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  The Patient requires high complexity decision making for assessment and support, frequent evaluation and titration of therapies, application of advanced monitoring technologies and extensive interpretation of multiple databases. Critical Care Time devoted to patient care services described in this note is 42 minutes.   Overall, patient is critically ill, prognosis is guarded. Patient at high risk for cardiac arrest and death.

## 2018-03-26 NOTE — Progress Notes (Signed)
Pt back from cath lab

## 2018-03-26 NOTE — ED Notes (Signed)
Attempt to call report, rn to return call.

## 2018-03-26 NOTE — ED Notes (Signed)
Critical troponin of 0.34 called from lab. Dr. Dolores FrameSung notified, no new verbal orders received.

## 2018-03-26 NOTE — ED Provider Notes (Signed)
[facility] Emergency Department Provider Note   ____________________________________________   First MD Initiated Contact with Patient 03/26/18 [phone]     (approximate)  I have reviewed the triage vital signs and the nursing notes.   HISTORY  Chief Complaint Chest Pain    HPI Nathaniel Chandler is a 47 y.o. male who presents to the ED from home with a chief complaint of left chest pain.  Patient had an episode 1 week ago which resolved after he took Tums.  Patient mowed the lawn with his riding mower, then turned on the grill this evening.  Noticed sweating which is unusual for him.  Complains of squeezing and pressure type pain which is nonradiating.  Denies associated shortness of breath, nausea/vomiting, palpitations or dizziness.  Denies recent fever, chills, abdominal pain, dysuria, diarrhea.  Denies recent travel or trauma.  Knows he has hypertension but does not go to the doctor and does not take medications.   Past Medical History:  Diagnosis Date  . Hypertension     Patient Active Problem List   Diagnosis Date Noted  . NSTEMI (non-ST elevated myocardial infarction) (HCC) 03/26/2018    Past Surgical History:  Procedure Laterality Date  . APPENDECTOMY      Prior to Admission medications   Medication Sig Start Date End Date Taking? Authorizing Provider  NON FORMULARY Take 1 Dose by mouth daily. BC Sinus Pain & Congestion   Yes [provider]    Allergies Sulfa antibiotics  Family history Diabetes Hypertension Hyperlipidemia  Social History Social History   Tobacco Use  . Smoking status: Current Every Day Smoker    Types: Cigarettes  . Smokeless tobacco: Never Used  Substance Use Topics  . 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 Drug use: Yes    Types: Marijuana    Comment: last smoked yesterday    Review of Systems  Constitutional: No fever/chills Eyes: No visual changes. ENT: No  sore throat. Cardiovascular: Positive for chest pain. Respiratory: Denies shortness of breath. Gastrointestinal: No abdominal pain.  No nausea, no vomiting.  No diarrhea.  No constipation. Genitourinary: Negative for dysuria. Musculoskeletal: Negative for back pain. Skin: Negative for rash. Neurological: Negative for headaches, focal weakness or numbness.   ____________________________________________   PHYSICAL EXAM:  VITAL SIGNS: ED Triage Vitals  Enc Vitals Group     BP 03/26/18 0434 (!) 249/124     Pulse Rate 03/26/18 0434 78     Resp 03/26/18 0434 18     Temp 03/26/18 0434 97.7 F (36.5 C)     Temp Source 03/26/18 0434 Oral     SpO2 03/26/18 0434 100 %     Weight 03/26/18 0433 270 lb (122.5 kg)     Height 03/26/18 0433 6\' 2"  (1.88 m)     Head Circumference --      Peak Flow --      Pain Score 03/26/18 0432 4     Pain Loc --      Pain Edu? --      Excl. in GC? --     Constitutional: Alert and oriented. Well appearing and in mild acute distress. Eyes: Conjunctivae are normal. PERRL. EOMI. Head: Atraumatic. Nose: No congestion/rhinnorhea. Mouth/Throat: Mucous membranes are moist.  Oropharynx non-erythematous. Neck: No stridor.   Cardiovascular: Normal rate, regular rhythm. Grossly normal heart sounds.  Good peripheral circulation. Respiratory: Normal respiratory effort.  No retractions. Lungs CTAB. Gastrointestinal:  Soft and nontender. No distention. No abdominal bruits. No CVA tenderness. Musculoskeletal: No lower extremity tenderness nor edema.  No joint effusions. Neurologic:  Normal speech and language. No gross focal neurologic deficits are appreciated. No gait instability. Skin:  Skin is warm, dry and intact. No rash noted. Psychiatric: Mood and affect are normal. Speech and behavior are normal.  ____________________________________________   LABS (all labs ordered are listed, but only abnormal results are displayed)  Labs Reviewed  BASIC METABOLIC PANEL  - Abnormal; Notable for the following components:      Result Value   Glucose, Bld 159 (*)    All other components within normal limits  CBC - Abnormal; Notable for the following components:   WBC 13.2 (*)    RDW 14.7 (*)    All other components within normal limits  TROPONIN I - Abnormal; Notable for the following components:   Troponin I 0.34 (*)    All other components within normal limits  PROTIME-INR  APTT  HEPARIN LEVEL (UNFRACTIONATED)   ____________________________________________  EKG  ED ECG REPORT I, Marygrace Sandoval J, the attending physician, personally viewed and interpreted this ECG.   Date: 03/26/2018  EKG Time: 0434  Rate: 76  Rhythm: normal EKG, normal sinus rhythm  Axis: Normal  Intervals:none  ST&T Change: T wave inversion inferior leads No prior EKG for comparison  ED ECG REPORT I, Sherrie Marsan J, the attending physician, personally viewed and interpreted this ECG.   Date: 03/26/2018  EKG Time: 0638  Rate: 64  Rhythm: normal EKG, normal sinus rhythm  Axis: Normal  Intervals:none  ST&T Change: T wave inversion in inferior leads  ____________________________________________  RADIOLOGY  ED MD interpretation: Small left pleural effusion versus pleural thickening  Official radiology report(s): Dg Chest Port 1 View  Result Date: 03/26/2018 CLINICAL DATA:  LEFT chest pain beginning at 7 p.m. History of hypertension. EXAM: PORTABLE CHEST 1 VIEW COMPARISON:  None. FINDINGS: Cardiac silhouette is mildly enlarged. Mediastinal silhouette is not suspicious. Blunting of the LEFT costophrenic angle with LEFT lung base strandy densities. No pneumothorax. Soft tissue planes and included osseous structures are non suspicious. IMPRESSION: 1. Small LEFT pleural effusion versus pleural thickening with LEFT lung base atelectasis/scarring. 2. Mild cardiomegaly. Electronically Signed   By: Awilda Metro M.D.   On: 03/26/2018 06:08     ____________________________________________   PROCEDURES  Procedure(s) performed: None  Procedures  Critical Care performed: Yes, see critical care note(s)   CRITICAL CARE Performed by: Irean Hong   Total critical care time: 45 minutes  Critical care time was exclusive of separately billable procedures and treating other patients.  Critical care was necessary to treat or prevent imminent or life-threatening deterioration.  Critical care was time spent personally by me on the following activities: development of treatment plan with patient and/or surrogate as well as nursing, discussions with consultants, evaluation of patient's response to treatment, examination of patient, obtaining history from patient or surrogate, ordering and performing treatments and interventions, ordering and review of laboratory studies, ordering and review of radiographic studies, pulse oximetry and re-evaluation of patient's condition.  ____________________________________________   INITIAL IMPRESSION / ASSESSMENT AND PLAN / ED COURSE  As part of my medical decision making, I reviewed the following data within the electronic MEDICAL RECORD NUMBER Nursing notes reviewed and incorporated, Labs reviewed, EKG interpreted, Old chart reviewed, Radiograph reviewed and Notes from prior ED visits   47 year old male with hypertension, not on medications, who presents with chest pain and elevated blood pressure. Differential  diagnosis includes, but is not limited to, ACS, aortic dissection, pulmonary embolism, cardiac tamponade, pneumothorax, pneumonia, pericarditis, myocarditis, GI-related causes including esophagitis/gastritis, and musculoskeletal chest wall pain.    Patient is currently chest pain-free.  Will administer 4 baby aspirin, apply nitroglycerin paste, check cardiac panel including troponin.  Clinical Course as of Mar 26 646  Mon Mar 26, 2018  [id number] Updated patient of lab results.  Long  discussion with patient who agrees to stay for hospitalization.  Will start heparin bolus and drip.  Will start nitroglycerin drip.   [JS]  [id number] Patient stated he was feeling bad on nitro drip 40 mcgs per minute.  Blood pressure currently 144/93.  Have decreased this to 20 mcgs per minute.  Repeat EKG remains unchanged.   [JS]    Clinical Course User Index [JS] Irean HongSung, Lyncoln Ledgerwood J, MD     ____________________________________________   FINAL CLINICAL IMPRESSION(S) / ED DIAGNOSES  Final diagnoses:  NSTEMI (non-ST elevated myocardial infarction) (HCC)  Unstable angina (HCC)  Unstable angina pectoris [facility](HCC)  Hypertensive urgency  Elevated troponin     ED Discharge Orders    None       Note:  This document was prepared using Dragon voice recognition software and may include unintentional dictation errors.    Irean HongSung, Zein Helbing J, MD 03/26/18 [phone]

## 2018-03-26 NOTE — Progress Notes (Signed)
Patient was seen and examined..  Denies chest pain.  Had cardiac cath done.  RCA stent was placed.  Resting comfortably.

## 2018-03-26 NOTE — ED Notes (Signed)
Dr. Diamond in to see pt.  

## 2018-03-26 NOTE — ED Notes (Signed)
Pt being transferred to ICU at this time.

## 2018-03-27 ENCOUNTER — Encounter: Payer: Self-pay | Admitting: Cardiovascular Disease

## 2018-03-27 LAB — MAGNESIUM: MAGNESIUM: 2.1 mg/dL (ref 1.7–2.4)

## 2018-03-27 LAB — LIPID PANEL
CHOL/HDL RATIO: 4 ratio
Cholesterol: 124 mg/dL (ref 0–200)
HDL: 31 mg/dL — ABNORMAL LOW (ref >40)
LDL Cholesterol: 72 mg/dL (ref 0–99)
Triglycerides: 104 mg/dL (ref <150)
VLDL: 21 mg/dL (ref 0–40)

## 2018-03-27 LAB — BASIC METABOLIC PANEL
Anion gap: 8 (ref 5–15)
BUN: 11 mg/dL (ref 6–20)
CALCIUM: 9 mg/dL (ref 8.9–10.3)
CHLORIDE: 102 mmol/L (ref 98–111)
CO2: 27 mmol/L (ref 22–32)
CREATININE: 1 mg/dL (ref 0.61–1.24)
GFR calc Af Amer: >60 mL/min (ref >60)
GFR calc non Af Amer: >60 mL/min (ref >60)
GLUCOSE: 124 mg/dL — AB (ref 70–99)
Potassium: 4 mmol/L (ref 3.5–5.1)
Sodium: 137 mmol/L (ref 135–145)

## 2018-03-27 LAB — TROPONIN I
TROPONIN I: 5.44 ng/mL — AB (ref <0.03)
Troponin I: 4.68 ng/mL (ref <0.03)

## 2018-03-27 LAB — CBC
HCT: 41.4 % (ref 40.0–52.0)
HEMOGLOBIN: 14 g/dL (ref 13.0–18.0)
MCH: 28.2 pg (ref 26.0–34.0)
MCHC: 33.9 g/dL (ref 32.0–36.0)
MCV: 83.2 fL (ref 80.0–100.0)
PLATELETS: 269 10*3/uL (ref 150–440)
RBC: 4.97 MIL/uL (ref 4.40–5.90)
RDW: 14.9 % — ABNORMAL HIGH (ref 11.5–14.5)
WBC: 13.4 10*3/uL — ABNORMAL HIGH (ref 3.8–10.6)

## 2018-03-27 MED ORDER — ASPIRIN 81 MG PO TBEC: 81.0000 mg | DELAYED_RELEASE_TABLET | Freq: Every day | ORAL | Status: DC

## 2018-03-27 MED ORDER — ALPRAZOLAM 0.25 MG PO TABS: 0.2500 mg | ORAL_TABLET | Freq: Two times a day (BID) | ORAL | 0 refills | Status: DC | PRN

## 2018-03-27 MED ORDER — ISOSORBIDE MONONITRATE ER 30 MG PO TB24: 30.0000 mg | ORAL_TABLET | Freq: Every day | ORAL | 0 refills | Status: DC

## 2018-03-27 MED ORDER — ISOSORBIDE MONONITRATE ER 30 MG PO TB24
30.0000 mg | ORAL_TABLET | Freq: Every day | ORAL | Status: DC
  Administered 2018-03-27: 30 mg via ORAL
  Filled 2018-03-27: qty 1

## 2018-03-27 MED ORDER — ACETAMINOPHEN 325 MG PO TABS: 650.0000 mg | ORAL_TABLET | Freq: Four times a day (QID) | ORAL | Status: AC | PRN

## 2018-03-27 MED ORDER — HYDRALAZINE HCL 20 MG/ML IJ SOLN
10.0000 mg | INTRAMUSCULAR | Status: DC | PRN
  Administered 2018-03-27: 20 mg via INTRAVENOUS
  Administered 2018-03-27: 10 mg via INTRAVENOUS
  Filled 2018-03-27 (×2): qty 1

## 2018-03-27 MED ORDER — ENALAPRIL MALEATE 10 MG PO TABS: 10.0000 mg | ORAL_TABLET | Freq: Every day | ORAL | 0 refills | Status: DC

## 2018-03-27 MED ORDER — CLOPIDOGREL BISULFATE 75 MG PO TABS: 75.0000 mg | ORAL_TABLET | Freq: Every day | ORAL | 0 refills | Status: DC

## 2018-03-27 MED ORDER — NICOTINE 21 MG/24HR TD PT24: 21.0000 mg | MEDICATED_PATCH | Freq: Every day | TRANSDERMAL | 0 refills | Status: DC

## 2018-03-27 MED ORDER — METOPROLOL TARTRATE 25 MG PO TABS: 12.5000 mg | ORAL_TABLET | Freq: Two times a day (BID) | ORAL | 0 refills | Status: DC

## 2018-03-27 MED ORDER — ATORVASTATIN CALCIUM 80 MG PO TABS: 80.0000 mg | ORAL_TABLET | Freq: Every day | ORAL | 0 refills | Status: DC

## 2018-03-27 NOTE — Progress Notes (Signed)
Pt ambulated by tech and tolerated well, denies pain, SOB,no assistance need.

## 2018-03-27 NOTE — Progress Notes (Signed)
Pt was able to rest during the night.  Pt is NSR on the monitor.  All pulses are 2+ and all extremities are warm to the touch.  Pt's right groin site remains a level 0.  Early in the morning, pt did complain of some "either pain or slight discomfort" in his left chest that would come and go.  Pt rated it a 2 or 3 out of 10.  Pt was not diaphoretic, did not complain of any nausea.  Pain did not radiate.  Pt was not having any ectopy on the monitor.  Annabelle Harmanana, NP made aware.  There were no new orders given.

## 2018-03-27 NOTE — Discharge Summary (Signed)
[facility] [place] at [facility]   PATIENT NAME: Nathaniel Chandler    MR#:  [id number]  DATE OF BIRTH:  03-31-1971  DATE OF ADMISSION:  03/26/2018 ADMITTING PHYSICIAN: Arnaldo Natal, MD  DATE OF DISCHARGE: 03/27/18   PRIMARY CARE PHYSICIAN: Patient, No Pcp Per    ADMISSION DIAGNOSIS:  Unstable angina (HCC) [I20.0] Unstable angina pectoris (HCC) [I20.0] Elevated troponin [R74.8] NSTEMI (non-ST elevated myocardial infarction) (HCC) [I21.4] Hypertensive urgency [I16.0]  DISCHARGE DIAGNOSIS:  Principal Problem:   NSTEMI (non-ST elevated myocardial infarction) (HCC) Active Problems:   Uncontrolled hypertension   Smoker   Family history of coronary artery disease   NSVT (nonsustained ventricular tachycardia) (HCC)   SECONDARY DIAGNOSIS:   Past Medical History:  Diagnosis Date  . Hypertension     HOSPITAL COURSE:   HPI: The patient with past medical history of hypertension presents to the emergency department complaining of chest pain.  The pain began at rest and is located over his left chest.  Pain radiates into his shoulders bilaterally and into his neck.  The patient admits to diaphoresis but denies nausea, vomiting or shortness of breath.  His blood pressure was greater than 200/100 upon arrival. The patient was placed on a nitro drip. Troponin level was elevated and the patient has some inversion of T waves in leads III and aVF which prompted initiation of a heparin drip prior to the emergency department staff calling the hospitalist service for admission.   1.  NSTEMI: Elevated troponin, ongoing chest pain and T wave inversions at the time of admission.    Initially was on heparin drip.   Patient was seen by [facility].  Monitor telemetry.    Patient had cardiac catheterization 03/27/2018 and had PCI to distal RCA .  Denies any chest pain or shortness of breath today. plan is to discharge him with isosorbide mononitrate 30 mg once daily Dual  antiplatelet therapy with aspirin and Plavix High intensity statin atorvastatin 80 mg once daily Okay to discharge patient from cardiology Dr.End and Dr. Lonn Georgia standpoint after  ambulating the patient  2.  Hypertensive emergency: Weaned off nitro drip.    Patient is started on metoprolol 12.5 mg p.o. twice daily, enalapril 10 mg once daily and Imdur 30 mg once daily.  Continue close monitoring of the blood pressure Improving clinically  3.  Leukocytosis: Stress demargination.  No indication of infectious etiology.  4.  DVT prophylaxis:     DISCHARGE CONDITIONS:   stable CONSULTS OBTAINED:  Treatment Team:  Antonieta Iba, MD   PROCEDURES cardiac cath- stent placement  DRUG ALLERGIES:   Allergies  Allergen Reactions  . Sulfa Antibiotics Anaphylaxis    DISCHARGE MEDICATIONS:   Allergies as of 03/27/2018      Reactions   Sulfa Antibiotics Anaphylaxis      Medication List    STOP taking these medications   NON FORMULARY     TAKE these medications   acetaminophen 325 MG tablet Commonly known as:  TYLENOL Take 2 tablets (650 mg total) by mouth every 6 (six) hours as needed for mild pain (or Fever >/= 101).   ALPRAZolam 0.25 MG tablet Commonly known as:  XANAX Take 1 tablet (0.25 mg total) by mouth 2 (two) times daily as needed for anxiety.   aspirin 81 MG EC tablet Take 1 tablet (81 mg total) by mouth daily. Start taking on:  03/28/2018   atorvastatin 80 MG tablet Commonly known as:  LIPITOR Take 1 tablet (  80 mg total) by mouth daily at 6 PM.   clopidogrel 75 MG tablet Commonly known as:  PLAVIX Take 1 tablet (75 mg total) by mouth daily with breakfast. Start taking on:  03/28/2018   enalapril 10 MG tablet Commonly known as:  VASOTEC Take 1 tablet (10 mg total) by mouth daily. Start taking on:  03/28/2018   isosorbide mononitrate 30 MG 24 hr tablet Commonly known as:  IMDUR Take 1 tablet (30 mg total) by mouth daily. Start taking on:  03/28/2018    metoprolol tartrate 25 MG tablet Commonly known as:  LOPRESSOR Take 0.5 tablets (12.5 mg total) by mouth 2 (two) times daily.   nicotine 21 mg/24hr patch Commonly known as:  NICODERM CQ - dosed in mg/24 hours Place 1 patch (21 mg total) onto the skin daily. Start taking on:  03/28/2018        DISCHARGE INSTRUCTIONS:   Follow-up with primary care physician in 2 to 3 days Follow-up with Dr. Mariah Milling in a week Outpatient cardiac rehab  DIET:  Cardiac diet  DISCHARGE CONDITION:  Stable  ACTIVITY:  Activity as tolerated  OXYGEN:  Home Oxygen: No.   Oxygen Delivery: room air  DISCHARGE LOCATION:  home   If you experience worsening of your admission symptoms, develop shortness of breath, life threatening emergency, suicidal or homicidal thoughts you must seek medical attention immediately by calling 911 or calling your MD immediately  if symptoms less severe.  You Must read complete instructions/literature along with all the possible adverse reactions/side effects for all the Medicines you take and that have been prescribed to you. Take any new Medicines after you have completely understood and accpet all the possible adverse reactions/side effects.   Please note  You were cared for by a hospitalist during your hospital stay. If you have any questions about your discharge medications or the care you received while you were in the hospital after you are discharged, you can call the unit and asked to speak with the hospitalist on call if the hospitalist that took care of you is not available. Once you are discharged, your primary care physician will handle any further medical issues. Please note that NO REFILLS for any discharge medications will be authorized once you are discharged, as it is imperative that you return to your primary care physician (or establish a relationship with a primary care physician if you do not have one) for your aftercare needs so that they can reassess  your need for medications and monitor your lab values.     Today  Chief Complaint  Patient presents with  . Chest Pain   Patient is feeling fine.  Denies any chest pain or shortness of breath.  Will discharge patient after ambulation  ROS:  CONSTITUTIONAL: Denies fevers, chills. Denies any fatigue, weakness.  EYES: Denies blurry vision, double vision, eye pain. EARS, NOSE, THROAT: Denies tinnitus, ear pain, hearing loss. RESPIRATORY: Denies cough, wheeze, shortness of breath.  CARDIOVASCULAR: Denies chest pain, palpitations, edema.  GASTROINTESTINAL: Denies nausea, vomiting, diarrhea, abdominal pain. Denies bright red blood per rectum. GENITOURINARY: Denies dysuria, hematuria. ENDOCRINE: Denies nocturia or thyroid problems. HEMATOLOGIC AND LYMPHATIC: Denies easy bruising or bleeding. SKIN: Denies rash or lesion. MUSCULOSKELETAL: Denies pain in neck, back, shoulder, knees, hips or arthritic symptoms.  NEUROLOGIC: Denies paralysis, paresthesias.  PSYCHIATRIC: Denies anxiety or depressive symptoms.   VITAL SIGNS:  Blood pressure (!) 163/97, pulse 67, temperature 98.1 F (36.7 C), temperature source Oral, resp. rate (!) 25, height  6\' 2"  (1.88 m), weight 121.7 kg (268 lb 4.8 oz), SpO2 97 %.  I/O:    Intake/Output Summary (Last 24 hours) at 03/27/2018 1503 Last data filed at 03/27/2018 1349 Gross per 24 hour  Intake 2427.68 ml  Output 2325 ml  Net 102.68 ml    PHYSICAL EXAMINATION:  GENERAL:  47 y.o.-year-old patient lying in the bed with no acute distress.  EYES: Pupils equal, round, reactive to light and accommodation. No scleral icterus. Extraocular muscles intact.  HEENT: Head atraumatic, normocephalic. Oropharynx and nasopharynx clear.  NECK:  Supple, no jugular venous distention. No thyroid enlargement, no tenderness.  LUNGS: Normal breath sounds bilaterally, no wheezing, rales,rhonchi or crepitation. No use of accessory muscles of respiration.  CARDIOVASCULAR: S1, S2  normal. No murmurs, rubs, or gallops.  ABDOMEN: Soft, non-tender, non-distended. Bowel sounds present. No organomegaly or mass.  EXTREMITIES: Right groin area with clean dressing no pedal edema, cyanosis, or clubbing.  NEUROLOGIC: Cranial nerves II through XII are intact. Muscle strength 5/5 in all extremities. Sensation intact. Gait not checked.  PSYCHIATRIC: The patient is alert and oriented x 3.  SKIN: No obvious rash, lesion, or ulcer.   DATA REVIEW:   CBC Recent Labs  Lab 03/27/18 0428  WBC 13.4*  HGB 14.0  HCT 41.4  PLT 269    Chemistries  Recent Labs  Lab 03/27/18 0428  NA 137  K 4.0  CL 102  CO2 27  GLUCOSE 124*  BUN 11  CREATININE 1.00  CALCIUM 9.0  MG 2.1    Cardiac Enzymes Recent Labs  Lab 03/27/18 1319  TROPONINI 4.68*    Microbiology Results  Results for orders placed or performed during the hospital encounter of 03/26/18  MRSA PCR Screening     Status: None   Collection Time: 03/26/18  7:43 AM  Result Value Ref Range Status   MRSA by PCR NEGATIVE NEGATIVE Final    Comment:        The GeneXpert MRSA Assay (FDA approved for NASAL specimens only), is one component of a comprehensive MRSA colonization surveillance program. It is not intended to diagnose MRSA infection nor to guide or monitor treatment for MRSA infections. Performed at [facility], [address] [place], [place] [address]     RADIOLOGY:  Dg Chest Port 1 View  Result Date: 03/26/2018 CLINICAL DATA:  LEFT chest pain beginning at 7 p.m. History of hypertension. EXAM: PORTABLE CHEST 1 VIEW COMPARISON:  None. FINDINGS: Cardiac silhouette is mildly enlarged. Mediastinal silhouette is not suspicious. Blunting of the LEFT costophrenic angle with LEFT lung base strandy densities. No pneumothorax. Soft tissue planes and included osseous structures are non suspicious. IMPRESSION: 1. Small LEFT pleural effusion versus pleural thickening with LEFT lung base  atelectasis/scarring. 2. Mild cardiomegaly. Electronically Signed   By: Awilda Metro M.D.   On: 03/26/2018 06:08    EKG:   Orders placed or performed during the hospital encounter of 03/26/18  . EKG 12-Lead  . EKG 12-Lead  . ED EKG within 10 minutes  . ED EKG within 10 minutes  . ED EKG  . ED EKG  . EKG 12-Lead immediately post procedure  . EKG 12-Lead  . EKG 12-Lead immediately post procedure  . EKG 12-Lead  . EKG 12-Lead  . EKG 12-Lead  . EKG      Management plans discussed with the patient, family and they are in agreement.  CODE STATUS:     Code Status Orders  (From admission, onward)  Start     Ordered   03/26/18 1438  Full code  Continuous     03/26/18 1437    Code Status History    Date Active Date Inactive Code Status Order ID Comments User Context   03/26/2018 0735 03/26/2018 1437 Full Code [id number]  Arnaldo Nataliamond, Michael S, MD Inpatient      TOTAL TIME TAKING CARE OF THIS PATIENT: 45  minutes.   Note: This dictation was prepared with Dragon dictation along with smaller phrase technology. Any transcriptional errors that result from this process are unintentional.   @[facility] @  on 03/27/2018 at 3:03 PM  Between 7am to 6pm - Pager - [phone]  After 6pm go to [email or web address] - password [email or web address]  Fabio Neighborsagle [place] [facility]  Office  [phone]  CC: Primary care physician; Patient, No Pcp Per

## 2018-03-27 NOTE — Progress Notes (Signed)
Discharge education provided, family member at bedside. Pt verbalized understanding. Pt A&O denies, pain, vitals signs stable. Transported via wheelchair.

## 2018-03-27 NOTE — Progress Notes (Signed)
Follow up - Critical Care Medicine Note  Patient Details:    Nathaniel Chandler is an 47 y.o. male presented to the emergency department for evaluation of chest pain. History of hypertension, found to have inferior wall changes, status post catheterization with RCA stenting, PCI/DES. Lines, Airways, Drains:    Anti-infectives:  Anti-infectives (From admission, onward)   None      Microbiology: Results for orders placed or performed during the hospital encounter of 03/26/18  MRSA PCR Screening     Status: None   Collection Time: 03/26/18  7:43 AM  Result Value Ref Range Status   MRSA by PCR NEGATIVE NEGATIVE Final    Comment:        The GeneXpert MRSA Assay (FDA approved for NASAL specimens only), is one component of a comprehensive MRSA colonization surveillance program. It is not intended to diagnose MRSA infection nor to guide or monitor treatment for MRSA infections. Performed at [facility], [address] [place], [place] [address]   Events:   [facility]  Result Date: 03/26/2018 CLINICAL DATA:  LEFT chest pain beginning at 7 p.m. History of hypertension. EXAM: PORTABLE CHEST 1 VIEW COMPARISON:  None. FINDINGS: Cardiac silhouette is mildly enlarged. Mediastinal silhouette is not suspicious. Blunting of the LEFT costophrenic angle with LEFT lung base strandy densities. No pneumothorax. Soft tissue planes and included osseous structures are non suspicious. IMPRESSION: 1. Small LEFT pleural effusion versus pleural thickening with LEFT lung base atelectasis/scarring. 2. Mild cardiomegaly. Electronically Signed   By: Awilda Metroourtnay  Bloomer M.D.   On: 03/26/2018 06:08    Consults: Treatment Team:  Antonieta IbaGollan, Timothy J, MD   Subjective:    Overnight Issues: tates he is doing better but does still have intermittent chest discomfort  Objective:  Vital signs for last 24 hours: Temp:  [97.8 F (36.6 C)-99.1 F (37.3 C)] 98.1 F (36.7 C) (07/30  0800) Pulse Rate:  [50-78] 68 (07/30 0800) Resp:  [9-29] 20 (07/30 0800) BP: (123-184)/(72-117) 154/83 (07/30 0800) SpO2:  [96 %-100 %] 98 % (07/30 0800) Weight:  [268 lb 4.8 oz (121.7 kg)] 268 lb 4.8 oz (121.7 kg) (07/30 0441)  Hemodynamic parameters for last 24 hours:    Intake/Output from previous day: 07/29 0701 - 07/30 0700 In: 1842.7 [P.O.:240; I.V.:1602.7] Out: 2575 [Urine:2575]  Intake/Output this shift: No intake/output data recorded.  Vent settings for last 24 hours:    Physical Exam:  .  HENT:  Head: Normocephalic and atraumatic.  Eyes: Pupils are equal, round, and reactive to light. EOM are normal.  Neck: Normal range of motion. Neck supple.  Cardiovascular:  No tenderness to palpation of chest wall. Regular rate and rhythm. Normal S1 and S2. No murmurs, rubs, gallops. 2+ radial and dorsalis pedis pulses bilaterally.   Pulmonary/Chest:  Clear to auscultation bilaterally. No wheezes, rales, or rhonchi.   Abdominal: Soft. Bowel sounds are normal. He exhibits no distension. There is no tenderness.  Musculoskeletal:       Right lower leg: He exhibits no edema.       Left lower leg: He exhibits no edema.    Assessment/Plan:   Ischemic cardiac disease, status post PCI/DES, RCA. Presently on aspirin, statin, Plavix, enalapril, indoor, metoprolol. Pending cardiology input regarding floor transfer  Tora KindredJohn Dovey Fatzinger, DO  Cyanna Neace 03/27/2018  *Care during the described time interval was provided by me and/or other providers on the critical care team.  I have reviewed this patient's available data, including medical history, events  of note, physical examination and test results as part of my evaluation.

## 2018-03-27 NOTE — Significant Event (Signed)
To whom it may concern:  Mr. Nathaniel Chandler has been hospitalized at [facility] from 7/29-7/30/2019 with an acute illness and should be excused from work due to this illness.  Work restrictions will be defined by his cardiologist.  Sincerely,  Billy Fischeravid Fiona Coto, MD PCCM service [phone] 03/27/2018 4:44 PM

## 2018-03-27 NOTE — Plan of Care (Signed)
Nutrition Education Note  RD consulted for nutrition education regarding a Heart Healthy diet.   Lipid Panel  No results found for: CHOL, TRIG, HDL, CHOLHDL, VLDL, LDLCALC  47 year old male with PMHx of HTN who is admitted with NSTEMI s/p PCI/DES to distal RCA.  Met with patient at bedside. He reports he is ready to discharge home today "one way or another." After discussing role of RD patient amenable to nutrition assessment and education. He reports he has a very good appetite and intake that is unchanged from baseline. He eats 3-4 meals per day. He is unable to report foods he typically chooses at meals. He does report he works at [employer] so he eats high fat cuts of beef frequently. Noted on his diet order he has requested [facility] on each tray. Patient reports he is weight stable.  RD provided "Heart Healthy Nutrition Therapy" handout from the Academy of Nutrition and Dietetics. Reviewed patient's dietary recall. Provided examples on ways to decrease sodium and fat intake in diet. Discouraged intake of processed foods and use of salt shaker. Encouraged fresh fruits and vegetables as well as whole grain sources of carbohydrates to maximize fiber intake. Teach back method used.  Expect fair to good compliance.  Body mass index is 34.45 kg/m. Pt meets criteria for Obesity Class I based on current BMI.  Current diet order is Heart Healthy, patient is consuming approximately 90-95% of meals at this time. Labs and medications reviewed. No further nutrition interventions warranted at this time. RD contact information provided. If additional nutrition issues arise, please re-consult RD.  Willey Blade, MS, [place], LDN Office: [phone] Pager: [phone] After Hours/Weekend Pager: [phone]

## 2018-03-27 NOTE — Progress Notes (Signed)
A&O denies pain. States "I want to leave and go home right now. I cannot afford to be here." Pt requesting to be discharged. Pt education provided and dr. Lonn Georgiaonforti notified. Pt non combative, but stated that his desire to get up and leave. Case management  Consult in place. Will continue to monitor.

## 2018-03-27 NOTE — Progress Notes (Signed)
Progress Note  Patient Name: Nathaniel Chandler Date of Encounter: 03/27/2018  Primary Cardiologist: New to [facility] - consult by Mariah Milling  Subjective   Continues to note mild chest discomfort that is not similar to his presenting pain.  No shortness of breath.  Post Cath Lab stable.  BP is improved though remains elevated in the 150s systolic.  Troponin has continued to trend up with a current value of 5.44.  Inpatient Medications    Scheduled Meds: . aspirin EC  81 mg Oral Daily  . atorvastatin  80 mg Oral q1800  . clopidogrel  75 mg Oral Q breakfast  . docusate sodium  100 mg Oral BID  . enalapril  10 mg Oral Daily  . isosorbide mononitrate  30 mg Oral Daily  . metoprolol tartrate  12.5 mg Oral BID  . nicotine  21 mg Transdermal Daily  . sodium chloride flush  3 mL Intravenous Q12H   Continuous Infusions: . sodium chloride     PRN Meds: sodium chloride, acetaminophen **OR** acetaminophen, ALPRAZolam, hydrALAZINE, ondansetron **OR** ondansetron (ZOFRAN) IV, sodium chloride flush, zolpidem   Vital Signs    Vitals:   03/27/18 0500 03/27/18 0600 03/27/18 0700 03/27/18 0800  BP: (!) 154/87 (!) 123/97 (!) 150/94 (!) 154/83  Pulse: 69 78 70 68  Resp: (!) 26 (!) 22 (!) 21 20  Temp:    98.1 F (36.7 C)  TempSrc:    Oral  SpO2: 97% 98% 97% 98%  Weight:      Height:        Intake/Output Summary (Last 24 hours) at 03/27/2018 1103 Last data filed at 03/27/2018 1005 Gross per 24 hour  Intake 2016.93 ml  Output 3075 ml  Net -1058.07 ml   Filed Weights   03/26/18 0433 03/26/18 0735 03/27/18 0441  Weight: 270 lb (122.5 kg) 267 lb 4.8 oz (121.2 kg) 268 lb 4.8 oz (121.7 kg)    Telemetry    NSR - Personally Reviewed  ECG    n/a - Personally Reviewed  Physical Exam   GEN: No acute distress.   Neck: No JVD. Cardiac: RRR, no murmurs, rubs, or gallops.  Right femoral cardiac catheterization site is mildly tender to palpation without active bleeding, bruising, swelling,  erythema, or warmth.  No bruit. Respiratory: Clear to auscultation bilaterally.  GI: Soft, nontender, non-distended.   MS: No edema; No deformity. Neuro:  Alert and oriented x 3; Nonfocal.  Psych: Normal affect.  Labs    Chemistry Recent Labs  Lab 03/26/18 0453 03/27/18 0428  NA 136 137  K 3.7 4.0  CL 99 102  CO2 26 27  GLUCOSE 159* 124*  BUN 13 11  CREATININE 1.14 1.00  CALCIUM 9.9 9.0  GFRNONAA >60 >60  GFRAA >60 >60  ANIONGAP 11 8     Hematology Recent Labs  Lab 03/26/18 0453 03/27/18 0428  WBC 13.2* 13.4*  RBC 5.61 4.97  HGB 15.9 14.0  HCT 46.7 41.4  MCV 83.3 83.2  MCH 28.3 28.2  MCHC 33.9 33.9  RDW 14.7* 14.9*  PLT 328 269    Cardiac Enzymes Recent Labs  Lab 03/26/18 0453 03/26/18 1620 03/27/18 0428  TROPONINI 0.34* 3.83* 5.44*   No results for input(s): TROPIPOC in the last 168 hours.   BNPNo results for input(s): BNP, PROBNP in the last 168 hours.   DDimer No results for input(s): DDIMER in the last 168 hours.   Radiology    Dg Chest [facility]  Result Date: 03/26/2018 IMPRESSION: 1. Small LEFT pleural effusion versus pleural thickening with LEFT lung base atelectasis/scarring. 2. Mild cardiomegaly. Electronically Signed   By: Awilda Metroourtnay  Bloomer M.D.   On: 03/26/2018 06:08    Cardiac Studies   LHC 03/26/2018: Conclusion     Mid RCA lesion is 80% stenosed.  Dist LM to Ost LAD lesion is 20% stenosed.  The left ventricular ejection fraction is 55-65% by visual estimate.  There is no aortic valve stenosis.  There is no mitral valve stenosis and no mitral valve regurgitation.  There is no aortic valve regurgitation.    PCI 03/26/2018: Conclusion     Post Atrio lesion is 100% stenosed.  Dist RCA lesion is 90% stenosed.  A drug-eluting stent was successfully placed using a STENT RESOLUTE ONYX 2.5X12.  Post intervention, there is a 0% residual stenosis.   1.  One-vessel CAD with occluded posterior lateral branch, and 95%  stenosis distal RCA 2.  Successful PCI with DES distal RCA    Patient Profile     47 y.o. male with history of hypertension admitted with a non-STEMI.  Assessment & Plan    1.  Non-STEMI: -Notes mild chest discomfort that is not similar to his presenting pain and significantly improved -Continue dual antiplatelet therapy with aspirin 81 mg daily and Plavix 75 mg daily without interruption for at least the next 12 months -Aggressive secondary prevention and lifestyle modification -Continue Lopressor, enalapril, and Lipitor -Add Imdur 30 mg daily -Discussed cardiac rehab as an outpatient (patient may not participate given he is without insurance) -Post-cath instructions discussed -Check lipid panel for further risk stratification, goal LDL less than 70  2.  NSVT: -No further ectopy noted on telemetry -Potassium at goal -Check magnesium -TSH normal -Lopressor as above  3.  Hypertension: -Remains elevated -Add Imdur as above -Continue Lopressor and enalapril  4.  Tobacco abuse: -Continue nicotine patch -Complete cessation is advised  5.  Leukocytosis: -Likely inflammatory in the setting of the above -No obvious signs of infection -Trend per IM   For questions or updates, please contact [facility] Please consult [email or web address] for contact info under Cardiology/STEMI.    Signed, Eula Listenyan Jermond Burkemper, PA-C [facility] Pager: [phone] 03/27/2018, 11:03 AM

## 2018-03-27 NOTE — Care Management (Signed)
RNCM contacted by ICU RN stating that patient shared that he cannot afford medications however he "does not qualify for any assistance because he has a car and a house".  Referral to Med Management and [facility]

## 2018-03-27 NOTE — Discharge Instructions (Signed)
Follow-up with primary care physician in 2 to 3 days Follow-up with Dr. Mariah MillingGollan in a week Outpatient cardiac rehab

## 2018-03-28 ENCOUNTER — Telehealth: Payer: Self-pay | Admitting: Internal Medicine

## 2018-03-28 NOTE — Telephone Encounter (Signed)
Patient contacted regarding [facility] on 03/27/18.   Patient understands to follow up with provider ? On 04/[place]/18 at 2pm at [place].  Patient understands discharge instructions? Yes  Patient understands medications and regiment? Yes  Patient understands to bring all medications to this visit? Yes    Patient would also like to know when he can return to work and if not he will need a note. He works at a [employer]. He does heavy lifting. Routing to LatimerRyan for return to work recommendations.

## 2018-03-28 NOTE — Telephone Encounter (Signed)
Patient will need to be out of work through at least his follow up with us on 8/9 to assess how he is doing and his cath site, especially given his job requires heavy lifting. 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 Ok for out of work note.

## 2018-03-28 NOTE — Telephone Encounter (Signed)
TCM....  Patient is being discharged   They saw Dr Roney MansEnd/Ryan   They are scheduled to see Corine ShelterLuke Kilroy on 04/06/18  They were seen for CP   They need to be seen within 7-10 days   Pt is not wait list   Please call

## 2018-03-29 ENCOUNTER — Telehealth: Payer: Self-pay | Admitting: Pharmacy Technician

## 2018-03-29 ENCOUNTER — Encounter: Payer: Self-pay | Admitting: *Deleted

## 2018-03-29 NOTE — Telephone Encounter (Signed)
Patient provided with new patient packet to obtain [facility] services.  [facility] that [facility] 2019 financial documetation in order to determine eligibility and obtain medication assistance.  Nathaniel DacostaBetty J. Chandler Care Manager [facility]

## 2018-03-29 NOTE — Telephone Encounter (Signed)
Patient aware of recommendations. He was very Adult nurseappreciative. Letter generated and placed at front desk for patient to pick up.

## 2018-04-02 ENCOUNTER — Telehealth: Payer: Self-pay

## 2018-04-02 NOTE — Telephone Encounter (Signed)
Flagged on EMMI report for not knowing if he received new prescriptions.  First attempt to reach patient made 04/02/18 at 2pm, however unable to reach patient.  Left voicemail encouraging callback. Will attempt at later time.

## 2018-04-03 NOTE — Telephone Encounter (Signed)
Second attempt to reach made 04/03/18 at 3:40pm, however unable to reach as phone went straight to voicemail.  Left message encouraging callback if any questions or needs.

## 2018-04-06 ENCOUNTER — Ambulatory Visit (INDEPENDENT_AMBULATORY_CARE_PROVIDER_SITE_OTHER): Payer: Self-pay | Admitting: Cardiology

## 2018-04-06 ENCOUNTER — Encounter: Payer: Self-pay | Admitting: Cardiology

## 2018-04-06 ENCOUNTER — Encounter: Payer: Self-pay | Admitting: *Deleted

## 2018-04-06 VITALS — BP 198/110 | Ht 74.0 in | Wt 270.8 lb

## 2018-04-06 DIAGNOSIS — F17200 Nicotine dependence, unspecified, uncomplicated: Secondary | ICD-10-CM

## 2018-04-06 DIAGNOSIS — I10 Essential (primary) hypertension: Secondary | ICD-10-CM

## 2018-04-06 DIAGNOSIS — I2510 Atherosclerotic heart disease of native coronary artery without angina pectoris: Secondary | ICD-10-CM

## 2018-04-06 DIAGNOSIS — Z9861 Coronary angioplasty status: Secondary | ICD-10-CM

## 2018-04-06 DIAGNOSIS — E785 Hyperlipidemia, unspecified: Secondary | ICD-10-CM

## 2018-04-06 DIAGNOSIS — Z8249 Family history of ischemic heart disease and other diseases of the circulatory system: Secondary | ICD-10-CM

## 2018-04-06 DIAGNOSIS — I214 Non-ST elevation (NSTEMI) myocardial infarction: Secondary | ICD-10-CM

## 2018-04-06 MED ORDER — HYDROCHLOROTHIAZIDE 12.5 MG PO CAPS: 12.5000 mg | ORAL_CAPSULE | Freq: Every day | ORAL | 0 refills | Status: DC

## 2018-04-06 NOTE — Assessment & Plan Note (Signed)
03/26/18- Troponin peak 5.44. EF 55-60%

## 2018-04-06 NOTE — Assessment & Plan Note (Signed)
Younger brother has h/o 3 stents

## 2018-04-06 NOTE — Patient Instructions (Addendum)
Medication Instructions: - Your physician has recommended you make the following change in your medication:   1) STOP imdur (isosorbide mononitrate)  2) START hydrochlorothiazide (HCTZ) 12.5 mg- take 1 tablet by mouth once daily  Labwork: - none ordered  Procedures/Testing: - none ordered  Follow-Up: - Your physician recommends that you schedule a follow-up appointment in: 2 weeks with Ward Givenshris Berge, NP- Thursday 04/19/18 at 1:00 pm   Any Additional Special Instructions Will Be Listed Below (If Applicable).     If you need a refill on your cardiac medications before your next appointment, please call your pharmacy.

## 2018-04-06 NOTE — Assessment & Plan Note (Signed)
He has cut back 

## 2018-04-06 NOTE — Progress Notes (Signed)
04/06/2018 Nathaniel Chandler   Apr 18, 1971  [id number]  Primary Physician Patient, No Pcp Per Primary Cardiologist: Dr Mariah MillingGollan  HPI:  47 y/o Caucasian male, works at a [employer],  no history of HTN, HLD, or DM- but doesn't go to doctors-ad[facility]26/18 with SSCP and ruled in for a NSTEMI- peak troponin 5.4.  He is a 2 PPD smoker and has a strong family history of CAD. He denied any history of medical problems but admitted he doesn't see any provider. He went to the cath lab and had an intervention with DES to his RCA. He is in the office today for follow up. He denies chest pain or SOB. He has not returned to work yet, his job does involve a significant amount of lifting. He is still smoking but is cutting back. He is trying to watch his diet. His main concern is financial. He is concerned about the cost of medications and how he is going to pay for his hospital bill.    Current Outpatient Medications  Medication Sig Dispense Refill  . acetaminophen (TYLENOL) 325 MG tablet Take 2 tablets (650 mg total) by mouth every 6 (six) hours as needed for mild pain (or Fever >/= 101).    Marland Kitchen. ALPRAZolam (XANAX) 0.25 MG tablet Take 1 tablet (0.25 mg total) by mouth 2 (two) times daily as needed for anxiety. 10 tablet 0  . aspirin EC 81 MG EC tablet Take 1 tablet (81 mg total) by mouth daily.    Marland Kitchen. atorvastatin (LIPITOR) 80 MG tablet Take 1 tablet (80 mg total) by mouth daily at 6 PM. 30 tablet 0  . clopidogrel (PLAVIX) 75 MG tablet Take 1 tablet (75 mg total) by mouth daily with breakfast. 30 tablet 0  . enalapril (VASOTEC) 10 MG tablet Take 1 tablet (10 mg total) by mouth daily. 30 tablet 0  . isosorbide mononitrate (IMDUR) 30 MG 24 hr tablet Take 1 tablet (30 mg total) by mouth daily. 30 tablet 0  . metoprolol tartrate (LOPRESSOR) 25 MG tablet Take 0.5 tablets (12.5 mg total) by mouth 2 (two) times daily. 30 tablet 0  . nicotine (NICODERM CQ - DOSED IN MG/24 HOURS) 21 mg/24hr patch Place 1 patch  (21 mg total) onto the skin daily. 28 patch 0   No current facility-administered medications for this visit.     Allergies  Allergen Reactions  . Sulfa Antibiotics Anaphylaxis  . Latex Rash    Past Medical History:  Diagnosis Date  . Hypertension     Social History   Socioeconomic History  . Marital status: Single    Spouse name: Not on file  . Number of children: Not on file  . Years of education: Not on file  . Highest education level: Not on file  Occupational History  . Not on file  Social Needs  . Financial resource strain: Not on file  . Food insecurity:    Worry: Not on file    Inability: Not on file  . Transportation needs:    Medical: Not on file    Non-medical: Not on file  Tobacco Use  . Smoking status: Current Every Day Smoker    Packs/day: 2.00    Types: Cigarettes  . Smokeless tobacco: Never Used  Substance and Sexual Activity  . 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 Drug use: Yes    Types: Marijuana    Comment:  last smoked yesterday  . Sexual activity: Not on file  Lifestyle  . Physical activity:    Days per week: Not on file    Minutes per session: Not on file  . Stress: Not on file  Relationships  . Social connections:    Talks on phone: Not on file    Gets together: Not on file    Attends religious service: Not on file    Active member of club or organization: Not on file    Attends meetings of clubs or organizations: Not on file    Relationship status: Not on file  . Intimate partner violence:    Fear of current or ex partner: Not on file    Emotionally abused: Not on file    Physically abused: Not on file    Forced sexual activity: Not on file  Other Topics Concern  . Not on file  Social History Narrative  . Not on file     Family History  Problem Relation Age of Onset  . Diabetes Mellitus II Father   . Hypertension Father   . Congestive Heart Failure Father   . CAD Brother        younger      Review of Systems: General: negative for chills, fever, night sweats or weight changes.  Cardiovascular: negative for chest pain, dyspnea on exertion, edema, orthopnea, palpitations, paroxysmal nocturnal dyspnea or shortness of breath Dermatological: negative for rash Respiratory: negative for cough or wheezing Urologic: negative for hematuria Abdominal: negative for nausea, vomiting, diarrhea, bright red blood per rectum, melena, or hematemesis Neurologic: negative for visual changes, syncope, or dizziness All other systems reviewed and are otherwise negative except as noted above.    Blood pressure (!) 198/110, height 6\' 2"  (1.88 m), weight 270 lb 12.8 oz (122.8 kg).  General appearance: alert, cooperative and no distress Neck: no carotid bruit and no JVD Lungs: clear to auscultation bilaterally Heart: regular rate and rhythm Extremities: extremities normal, atraumatic, no cyanosis or edema Skin: Skin color, texture, turgor normal. No rashes or lesions Neurologic: Grossly normal  EKG: NSR-64. NSST changes  ASSESSMENT AND PLAN:   NSTEMI (non-ST elevated myocardial infarction) (HCC) 03/26/18- Troponin peak 5.44. EF 55-60%  CAD S/P percutaneous coronary angioplasty RCA PCI with DES 03/26/18. No significant residual CAD post PCI   Uncontrolled hypertension Repeat B/P by me was 180/98  Smoker He has cut back  Family history of coronary artery disease Younger brother has h/o 3 stents  Dyslipidemia, goal LDL below 70 LDL was 72, HDL 31 on admission   PLAN  I added HCTZ 12.5 mg to his Vasotec with the thought that he could be changed to Vasoretic in the future. I considered adding Amlodipine but will take one step at a time, and try to keep cost in mind.  This may need to be added next visit.  He says his B/P is up because of the drive over here and he just smoked a cigarette before he got here.   Return to clinic in two weeks for B/P check and BMP.  We discussed smoking  cessation and the importance of a low sodium diet. I think he can stop the Imdur. If he is doing well he could return to work after that visit.   Corine Shelter PA-C 04/06/2018 3:23 PM

## 2018-04-06 NOTE — Assessment & Plan Note (Signed)
RCA PCI with DES 03/26/18. No significant residual CAD post PCI

## 2018-04-06 NOTE — Assessment & Plan Note (Signed)
LDL was 72, HDL 31 on admission

## 2018-04-06 NOTE — Assessment & Plan Note (Signed)
Repeat B/P by me was 180/98

## 2018-04-19 ENCOUNTER — Encounter: Payer: Self-pay | Admitting: Nurse Practitioner

## 2018-04-19 ENCOUNTER — Encounter: Payer: Self-pay | Admitting: *Deleted

## 2018-04-19 ENCOUNTER — Ambulatory Visit (INDEPENDENT_AMBULATORY_CARE_PROVIDER_SITE_OTHER): Payer: Self-pay | Admitting: Nurse Practitioner

## 2018-04-19 VITALS — BP 180/116 | HR 65 | Ht 74.0 in | Wt 264.0 lb

## 2018-04-19 DIAGNOSIS — I10 Essential (primary) hypertension: Secondary | ICD-10-CM

## 2018-04-19 DIAGNOSIS — E785 Hyperlipidemia, unspecified: Secondary | ICD-10-CM

## 2018-04-19 DIAGNOSIS — I2510 Atherosclerotic heart disease of native coronary artery without angina pectoris: Secondary | ICD-10-CM

## 2018-04-19 DIAGNOSIS — Z720 Tobacco use: Secondary | ICD-10-CM

## 2018-04-19 MED ORDER — ATORVASTATIN CALCIUM 80 MG PO TABS: 80.0000 mg | ORAL_TABLET | Freq: Every day | ORAL | 0 refills | Status: DC

## 2018-04-19 MED ORDER — HYDROCHLOROTHIAZIDE 12.5 MG PO CAPS: 25.0000 mg | ORAL_CAPSULE | Freq: Every day | ORAL | 0 refills | Status: DC

## 2018-04-19 MED ORDER — ENALAPRIL MALEATE 20 MG PO TABS
20.0000 mg | ORAL_TABLET | Freq: Every day | ORAL | 0 refills | Status: DC
Start: 2018-04-19 — End: 2018-05-22

## 2018-04-19 MED ORDER — CLOPIDOGREL BISULFATE 75 MG PO TABS: 75.0000 mg | ORAL_TABLET | Freq: Every day | ORAL | 0 refills | Status: DC

## 2018-04-19 MED ORDER — CARVEDILOL 3.125 MG PO TABS: 3.1250 mg | ORAL_TABLET | Freq: Two times a day (BID) | ORAL | 0 refills | Status: DC

## 2018-04-19 NOTE — Patient Instructions (Signed)
Medication Instructions: STOP the Metoprolol START Carvedilol 3.125 mg twice daily INCREASE the hydrochlorothiazide to 25 mg daily (2 tablets) INCREASE the Enalapril to 20 mg daily  If you need a refill on your cardiac medications before your next appointment, please call your pharmacy.   Labwork: Your provider would like for you to have the following labs today: BMET  Follow-Up: Your physician wants you to follow-up in 3-4 weeks with Dr. Mariah MillingGollan or an APP. You will receive a reminder letter in the mail two months in advance. If you don't receive a letter, please call our office at [phone] to schedule this follow-up appointment.  Thank you for choosing [facility]!

## 2018-04-19 NOTE — Progress Notes (Signed)
Office Visit    Patient Name: Nathaniel Chandler Date of Encounter: 04/19/2018  Primary Care Provider:  Patient, No Pcp Per Primary Cardiologist:  Julien Nordmann, MD  Chief Complaint    47 y/o ? with a h/o CAD s/p recent NSTEMI and PCI of the RCA, HTN, HL, tob abuse, and FH of premature CAD, who presents for follow-up related to hypertension.  Past Medical History    Past Medical History:  Diagnosis Date  . CAD (coronary artery disease)    a. 02/2018 NSTEMI/PCI: LM nl, LAD mild prox dzs, RI large/nl, LCX nl, OM1/2 nl, RCA nondom, 90d (2.5x12 Resolute Onyx DES), RPAV 100, EF 55-65%.  . Essential hypertension   . Hyperlipidemia LDL goal <70   . Tobacco abuse    Past Surgical History:  Procedure Laterality Date  . APPENDECTOMY    . CORONARY STENT INTERVENTION N/A 03/26/2018   Procedure: CORONARY STENT INTERVENTION;  Surgeon: Marcina Millard, MD;  Location: [facility] INVASIVE CV LAB;  Service: Cardiovascular;  Laterality: N/A;  . LEFT HEART CATH AND CORONARY ANGIOGRAPHY N/A 03/26/2018   Procedure: LEFT HEART CATH AND CORONARY ANGIOGRAPHY poss PCI;  Surgeon: Antonieta Iba, MD;  Location: [facility] INVASIVE CV LAB;  Service: Cardiovascular;  Laterality: N/A;    Allergies  Allergies  Allergen Reactions  . Sulfa Antibiotics Anaphylaxis  . Latex Rash    History of Present Illness    47 year old male with the above past medical history.  In late July, he was admitted to [facility] with substernal chest pain and ruled in for non-STEMI, peaking his troponin 5.4.  He underwent diagnostic catheterization revealing severe distal RCA stenosis with an occlusion of the RPAV.  The distal RCA was successfully treated with drug-eluting stent.  He had normal LV function by ventriculography during admission.  He was subsequently discharged on aspirin, statin, Plavix, beta-blocker, and ACE inhibitor therapy.  When he was seen in clinic on August 9, he was markedly hypertensive and he was placed  on HCTZ with plan for early follow-up.  He does not check his BP @ home but says that he is aware that he's had BPs into the 200's for at least 8-10 years.  He denies chest pain, palpitations, dyspnea, pnd, orthopnea, n, v, dizziness, syncope, edema, weight gain, or early satiety. BP today is 202/123.  After talking for some time, BP came down to 180/116.  Though he reports compliance with meds, he also says that he thinks he's on too much medicine and that he doesn't 'believe in better living through chemistry.'  He continues to smoke up to 1 ppd of cigarettes.  Home Medications    Prior to Admission medications   Medication Sig Start Date End Date Taking? Authorizing Provider  acetaminophen (TYLENOL) 325 MG tablet Take 2 tablets (650 mg total) by mouth every 6 (six) hours as needed for mild pain (or Fever >/= 101). 03/27/18  Yes Gouru, Deanna Artis, MD  ALPRAZolam Prudy Feeler) 0.25 MG tablet Take 1 tablet (0.25 mg total) by mouth 2 (two) times daily as needed for anxiety. 03/27/18  Yes Gouru, Deanna Artis, MD  aspirin EC 81 MG EC tablet Take 1 tablet (81 mg total) by mouth daily. 03/28/18  Yes Gouru, Deanna Artis, MD  atorvastatin (LIPITOR) 80 MG tablet Take 1 tablet (80 mg total) by mouth daily at 6 PM. 03/27/18  Yes Gouru, Aruna, MD  clopidogrel (PLAVIX) 75 MG tablet Take 1 tablet (75 mg total) by mouth daily with breakfast. 03/28/18  Yes Gouru,  Deanna Artis, MD  enalapril (VASOTEC) 10 MG tablet Take 1 tablet (10 mg total) by mouth daily. 03/28/18  Yes Gouru, Deanna Artis, MD  hydrochlorothiazide (MICROZIDE) 12.5 MG capsule Take 1 capsule (12.5 mg total) by mouth daily. 04/06/18 07/05/18 Yes Kilroy, Luke K, PA-C  metoprolol tartrate (LOPRESSOR) 25 MG tablet Take 0.5 tablets (12.5 mg total) by mouth 2 (two) times daily. 03/27/18  Yes Gouru, Aruna, MD  nicotine (NICODERM CQ - DOSED IN MG/24 HOURS) 21 mg/24hr patch Place 1 patch (21 mg total) onto the skin daily. 03/28/18  Yes Gouru, Deanna Artis, MD    Review of Systems    He denies chest pain,  palpitations, dyspnea, pnd, orthopnea, n, v, dizziness, syncope, edema, weight gain, or early satiety. He notes a lot of life stress related to poor finances, having to take care of his mother and a neighbor, etc.  All other systems reviewed and are otherwise negative except as noted above.  Physical Exam    VS:  BP (!) 180/116 (BP Location: Left Arm, Patient Position: Sitting, Cuff Size: Normal)   Pulse 65   Ht 6\' 2"  (1.88 m)   Wt 264 lb (119.7 kg)   BMI 33.90 kg/m  , BMI Body mass index is 33.9 kg/m. GEN: Well nourished, well developed, in no acute distress.  HEENT: normal.  Neck: Supple, no JVD, carotid bruits, or masses. Cardiac: RRR, no murmurs, rubs, or gallops. No clubbing, cyanosis, edema.  Radials/DP/PT 2+ and equal bilaterally.  Respiratory:  Respirations regular and unlabored, clear to auscultation bilaterally. GI: Soft, nontender, nondistended, BS + x 4. MS: no deformity or atrophy. Skin: warm and dry, no rash. Neuro:  Strength and sensation are intact. Psych: Normal affect.  Accessory Clinical Findings    ECG -regular sinus rhythm, 65, inferior infarct.  No acute changes.  Assessment & Plan    1.  Coronary artery disease: Status post recent non-STEMI with subsequent stenting of the distal RCA.  Known residual occluded, small RPAV.  Normal LV function by LV gram.  He has not been having any chest pain and reports compliance with aspirin, statin, beta-blocker, Plavix, and ACE inhibitor therapy.  He does not have insurance and does not plan on enrolling in cardiac rehabilitation.  2.  Uncontrolled hypertension: Blood pressure was markedly elevated on arrival today at 202/123.  After we talked about the indications for all of his medicines and his future risk, I repeated his blood pressure and got 180/116.  We discussed options for management and because he is strapped financially, he would prefer to try to work with medicines he is currently taking prior to adding anything  else.  In that setting, I am going to increase his enalapril to 20 mg.  I am increasing HCTZ to 25 mg.  He is due for a refill on his metoprolol and therefore I am going to switch him to carvedilol 3.125 mg twice daily for better blood pressure effect.  I will follow-up a basic metabolic panel today.  We have sent his prescriptions to [facility] where he has follow-up next Tuesday.  I would recommend that if pressures elevated at primary care follow-up, addition of amlodipine 5 mg would be appropriate next.  3.  Hyperlipidemia: LDL 72 on July 30.  Continue high potency statin therapy.  Would likely plan on fasting lipids and LFTs at follow-up visit.  4.  Tobacco abuse: Continues to smoke up to a pack a day.  Complete cessation advised.  5.  Disposition: Follow-up labs today.  Patient to purchase a blood pressure cuff for his home.  [facility] [facility] and have asked him to follow-up here in 2 to 3 weeks.  Nicolasa Duckinghristopher Eyden Dobie, NP 04/19/2018, 4:03 PM

## 2018-04-20 ENCOUNTER — Telehealth: Payer: Self-pay | Admitting: Nurse Practitioner

## 2018-04-20 LAB — BASIC METABOLIC PANEL
BUN/Creatinine Ratio: 11 (ref 9–20)
BUN: 13 mg/dL (ref 6–24)
CHLORIDE: 98 mmol/L (ref 96–106)
CO2: 28 mmol/L (ref 20–29)
Calcium: 9.8 mg/dL (ref 8.7–10.2)
Creatinine, Ser: 1.17 mg/dL (ref 0.76–1.27)
GFR calc Af Amer: 85 mL/min/{1.73_m2} (ref >59)
GFR calc non Af Amer: 73 mL/min/{1.73_m2} (ref >59)
Glucose: 93 mg/dL (ref 65–99)
POTASSIUM: 4.7 mmol/L (ref 3.5–5.2)
SODIUM: 140 mmol/L (ref 134–144)

## 2018-04-20 NOTE — Telephone Encounter (Addendum)
I left a message for the patient to call back for his lab results, but also asked that he have his current medication bottles in front of him when he calls back to confirm his meds.   I also attempted to call the pharmacy back. I left a message for them to call back if needed.

## 2018-04-20 NOTE — Telephone Encounter (Signed)
I left a message for the patient to call. 

## 2018-04-20 NOTE — Telephone Encounter (Signed)
Sherri pharmacist calling asking for a call back. She states we saw patient yesterday but would like to go over med list with us. For there are some she thinks may need to be taken off and added.  Please call back

## 2018-04-20 NOTE — Telephone Encounter (Signed)
Left a message for Sherri to call back.

## 2018-04-20 NOTE — Telephone Encounter (Signed)
Patient returning our call about lab work   Please advise

## 2018-04-23 MED ORDER — ASPIRIN 81 MG PO TBEC: 81.0000 mg | DELAYED_RELEASE_TABLET | Freq: Every day | ORAL | 0 refills | Status: AC

## 2018-04-23 NOTE — Telephone Encounter (Signed)
I spoke with the patient regarding his lab results. He states that he has not started the increased dose of enalapril/ HCTZ. He is seeing [facility] tomorrow and will pick up his prescriptions at that time. I have asked that he ask them is they can recheck a BMP in 1 week and if not we will need to check this. I asked that he call back and let us know one way of the other.  He voices understanding and is agreeable.

## 2018-04-23 NOTE — Telephone Encounter (Signed)
No call received back from the pharmacy. I did speak with the patient today and clarified all of his current medications.  He is currently taking:  1) HCTZ 12.5 mg once daily (new RX for increased dose of 12.5 mg take 2 tablets (25 mg) once daily to be picked up tomorrow at [facility])  2) Enalapril 10 mg once daily (new RX for increased dose of 20 mg once daily to be picked [facility] tomorrow)  3) Plavix 75 mg once daily ( RX refill sent in 04/19/18)  4) Lipitor 80 mg once daily (RX refill sent in 04/19/18)  5) ASA 81 mg once daily (request RX be sent to [facility])  6) Xanax 0.25 BID as needed  7) Tylenol 325 mg- take 2 tablets (650 mg) by mouth every 6 hours PRN pain  8) metoprolol (currently out of this RX, but Coreg 3.125 mg BID will be picked [facility] tomorrow [facility].   9) nicotine patch- not using, so will discontinue from his list of meds   In reviewing the patient's chart, all of his updated RX's were sent in to [facility] on 04/19/18 except ASA.  I will send this in today.   Will close this encounter as we have not heard back from the pharmacy.

## 2018-05-04 NOTE — Telephone Encounter (Signed)
Patient calling to make office aware he did his blood work today at [facility] and should be available soon Please call if needed for any additional questions

## 2018-05-07 NOTE — Telephone Encounter (Signed)
Labs received and scanned to Ward Givens, NP for review.

## 2018-05-08 NOTE — Telephone Encounter (Signed)
Results called to pt. Pt verbalized understanding of results and recommendations as follows:  Notes recorded by Creig Hines, NP on 05/07/2018 at 2:00 PM EDT Kidney's appear a little dry. Lytes, LFT's, TSH ok. Cont current meds. He should make sure that he is hydrating appropriately. ------

## 2018-05-08 NOTE — Telephone Encounter (Signed)
Patient returning call re labs

## 2018-05-20 DIAGNOSIS — I25118 Atherosclerotic heart disease of native coronary artery with other forms of angina pectoris: Secondary | ICD-10-CM | POA: Insufficient documentation

## 2018-05-20 NOTE — Progress Notes (Signed)
Cardiology Office Note  Date:  05/22/2018   ID:  Nathaniel Chandler, DOB 1970/09/15, MRN [id number]  PCP:  Patient, No Pcp Per   Chief Complaint  Patient presents with  . other    3-4 wk f/u c/o fatigue/feeling wiped out. Meds reviewed verbally with pt.    HPI:  47 y/o Caucasian male, works at a [employer],  Smoker Hypertension, chronic and poorly controlled Hyperlipidemia  [facility]/26/18 with SSCP NSTEMI- peak troponin 5.4.   He presents for follow-up of his coronary artery disease, stent to the RCA   DES to his RCA. 02/2018 NSTEMI/PCI: LM nl, LAD mild prox dzs, RI large/nl, LCX nl, OM1/2 nl, RCA nondom, 90d (2.5x12 Resolute Onyx DES), RPAV 100, EF 55-65%.  Very tired in follow-up today Used to work third shift Can not do sleeping pill Not sleeping well at night Lots of stress concerning his finances  No regular walking program He does not have blood pressure cuff at home Feels he is ready to go back to work but wants limitations on his hours as he is very tired Reports he is unable to work overtime  Lab work reviewed from primary care Normal renal function and LFTs  EKG personally reviewed by myself on todays visit Shows normal sinus rhythm rate 69 bpm consider old inferior MI    PMH:   has a past medical history of CAD (coronary artery disease), Essential hypertension, Hyperlipidemia LDL goal <70, and Tobacco abuse.  PSH:    Past Surgical History:  Procedure Laterality Date  . APPENDECTOMY    . CORONARY STENT INTERVENTION N/A 03/26/2018   Procedure: CORONARY STENT INTERVENTION;  Surgeon: Marcina MillardParaschos, Alexander, MD;  Location: [facility] INVASIVE CV LAB;  Service: Cardiovascular;  Laterality: N/A;  . LEFT HEART CATH AND CORONARY ANGIOGRAPHY N/A 03/26/2018   Procedure: LEFT HEART CATH AND CORONARY ANGIOGRAPHY poss PCI;  Surgeon: Antonieta IbaGollan, Jonta Gastineau J, MD;  Location: [facility] INVASIVE CV LAB;  Service: Cardiovascular;  Laterality: N/A;    Current Outpatient Medications   Medication Sig Dispense Refill  . acetaminophen (TYLENOL) 325 MG tablet Take 2 tablets (650 mg total) by mouth every 6 (six) hours as needed for mild pain (or Fever >/= 101).    Marland Kitchen. ALPRAZolam (XANAX) 0.25 MG tablet Take 1 tablet (0.25 mg total) by mouth 2 (two) times daily as needed for anxiety. 10 tablet 0  . aspirin 81 MG EC tablet Take 1 tablet (81 mg total) by mouth daily. 90 tablet 0  . atorvastatin (LIPITOR) 80 MG tablet Take 1 tablet (80 mg total) by mouth daily at 6 PM. 90 tablet 0  . carvedilol (COREG) 3.125 MG tablet Take 1 tablet (3.125 mg total) by mouth 2 (two) times daily. 180 tablet 0  . clopidogrel (PLAVIX) 75 MG tablet Take 1 tablet (75 mg total) by mouth daily with breakfast. 90 tablet 0  . enalapril (VASOTEC) 20 MG tablet Take 1 tablet (20 mg total) by mouth daily. 90 tablet 0  . hydrochlorothiazide (MICROZIDE) 12.5 MG capsule Take 2 capsules (25 mg total) by mouth daily. 180 capsule 0   No current facility-administered medications for this visit.      Allergies:   Sulfa antibiotics and Latex   Social History:  The patient  reports that he has been smoking cigarettes. He has been smoking about 2.00 packs per day. He has never used smokeless tobacco. He reports that he has current or past drug history. Drug: Marijuana. He reports that he does  not drink alcohol.   Family History:   family history includes CAD in his brother; Congestive Heart Failure in his father; Diabetes Mellitus II in his father; Hypertension in his father.    Review of Systems: Review of Systems  Constitutional: Positive for malaise/fatigue.  Respiratory: Negative.   Cardiovascular: Negative.   Gastrointestinal: Negative.   Musculoskeletal: Negative.   Neurological: Negative.   Psychiatric/Behavioral: Negative.   All other systems reviewed and are negative.    PHYSICAL EXAM: VS:  BP (!) 154/100 (BP Location: Left Arm, Patient Position: Sitting, Cuff Size: Normal)   Pulse 69   Ht 6\' 2"  (1.88  m)   Wt 267 lb (121.1 kg)   BMI 34.28 kg/m  , BMI Body mass index is 34.28 kg/m. GEN: Well nourished, well developed, in no acute distress  HEENT: normal  Neck: no JVD, carotid bruits, or masses Cardiac: RRR; no murmurs, rubs, or gallops,no edema  Respiratory:  clear to auscultation bilaterally, normal work of breathing GI: soft, nontender, nondistended, + BS MS: no deformity or atrophy  Skin: warm and dry, no rash Neuro:  Strength and sensation are intact Psych: euthymic mood, full affect   Recent Labs: 03/26/2018: TSH 1.500 03/27/2018: Hemoglobin 14.0; Magnesium 2.1; Platelets 269 04/19/2018: BUN 13; Creatinine, Ser 1.17; Potassium 4.7; Sodium 140    Lipid Panel Lab Results  Component Value Date   CHOL 124 03/27/2018   HDL 31 (L) 03/27/2018   LDLCALC 72 03/27/2018   TRIG 104 03/27/2018      Wt Readings from Last 3 Encounters:  05/22/18 267 lb (121.1 kg)  04/19/18 264 lb (119.7 kg)  04/06/18 270 lb 12.8 oz (122.8 kg)       ASSESSMENT AND PLAN:  NSTEMI (non-ST elevated myocardial infarction) (HCC) - Plan: EKG 12-Lead Stent placed to his distal RCA Continue current medications  Uncontrolled hypertension Stressed importance of taking his blood pressure pills Blood pressure continues to run high He will monitor blood pressure at home and call our office with numbers for more medication titration Potentially could add amlodipine Smoker We have encouraged him to continue to work on weaning his cigarettes and smoking cessation. He will continue to work on this and does not want any assistance with chantix.   NSVT (nonsustained ventricular tachycardia) (HCC) Continue beta-blocker Denies any palpitations, syncope or syncope  Atherosclerosis of native coronary artery of native heart with stable angina pectoris [facility]) Details of cardiac catheterization discussed with him   Disposition:   F/U  12 months Work note provided to him   Total encounter time more than 25  minutes  Greater than 50% was spent in counseling and coordination of care with the patient    Orders Placed This Encounter  Procedures  . EKG 12-Lead     Signed, Dossie Arbour, M.D., Ph.D. 05/22/2018  [facility] [place], [place] [phone]

## 2018-05-22 ENCOUNTER — Ambulatory Visit (INDEPENDENT_AMBULATORY_CARE_PROVIDER_SITE_OTHER): Payer: Self-pay | Admitting: Cardiovascular Disease

## 2018-05-22 ENCOUNTER — Encounter: Payer: Self-pay | Admitting: Cardiovascular Disease

## 2018-05-22 ENCOUNTER — Encounter: Payer: Self-pay | Admitting: *Deleted

## 2018-05-22 VITALS — BP 154/100 | HR 69 | Ht 74.0 in | Wt 267.0 lb

## 2018-05-22 DIAGNOSIS — I214 Non-ST elevation (NSTEMI) myocardial infarction: Secondary | ICD-10-CM

## 2018-05-22 DIAGNOSIS — I10 Essential (primary) hypertension: Secondary | ICD-10-CM

## 2018-05-22 DIAGNOSIS — I25118 Atherosclerotic heart disease of native coronary artery with other forms of angina pectoris: Secondary | ICD-10-CM

## 2018-05-22 DIAGNOSIS — F17200 Nicotine dependence, unspecified, uncomplicated: Secondary | ICD-10-CM

## 2018-05-22 DIAGNOSIS — I4729 Other ventricular tachycardia: Secondary | ICD-10-CM

## 2018-05-22 DIAGNOSIS — I472 Ventricular tachycardia: Secondary | ICD-10-CM

## 2018-05-22 MED ORDER — HYDROCHLOROTHIAZIDE 25 MG PO TABS: 25.0000 mg | ORAL_TABLET | Freq: Every day | ORAL | 3 refills | Status: DC

## 2018-05-22 MED ORDER — CLOPIDOGREL BISULFATE 75 MG PO TABS: 75.0000 mg | ORAL_TABLET | Freq: Every day | ORAL | 3 refills | Status: DC

## 2018-05-22 MED ORDER — ENALAPRIL MALEATE 20 MG PO TABS: 20.0000 mg | ORAL_TABLET | Freq: Every day | ORAL | 3 refills | Status: DC

## 2018-05-22 MED ORDER — CARVEDILOL 3.125 MG PO TABS: 3.1250 mg | ORAL_TABLET | Freq: Two times a day (BID) | ORAL | 3 refills | Status: DC

## 2018-05-22 NOTE — Patient Instructions (Addendum)
Please monitor your blood pressure Call the office with numbers  Medication Instructions:   No medication changes made  Labwork:  No new labs needed  Testing/Procedures:  No further testing at this time   Follow-Up: It was a pleasure seeing you in the office today. Please call us if you have new issues that need to be addressed before your next appt.  [phone]  Your physician wants you to follow-up in: 12 months.  You will receive a reminder letter in the mail two months in advance. If you don't receive a letter, please call our office to schedule the follow-up appointment.  If you need a refill on your cardiac medications before your next appointment, please call your pharmacy.  For educational health videos Log in to : [email or web address] Or : [email or web address], password : [email or web address]

## 2018-07-20 ENCOUNTER — Other Ambulatory Visit: Payer: Self-pay

## 2018-07-20 ENCOUNTER — Telehealth: Payer: Self-pay | Admitting: Cardiovascular Disease

## 2018-07-20 MED ORDER — CARVEDILOL 3.125 MG PO TABS: 3.1250 mg | ORAL_TABLET | Freq: Two times a day (BID) | ORAL | 3 refills | Status: DC

## 2018-07-20 MED ORDER — ATORVASTATIN CALCIUM 80 MG PO TABS: 80.0000 mg | ORAL_TABLET | Freq: Every day | ORAL | 3 refills | Status: DC

## 2018-07-20 MED ORDER — HYDROCHLOROTHIAZIDE 25 MG PO TABS: 25.0000 mg | ORAL_TABLET | Freq: Every day | ORAL | 3 refills | Status: DC

## 2018-07-20 MED ORDER — ENALAPRIL MALEATE 20 MG PO TABS: 20.0000 mg | ORAL_TABLET | Freq: Every day | ORAL | 3 refills | Status: DC

## 2018-07-20 MED ORDER — CLOPIDOGREL BISULFATE 75 MG PO TABS: 75.0000 mg | ORAL_TABLET | Freq: Every day | ORAL | 3 refills | Status: DC

## 2018-07-20 NOTE — Telephone Encounter (Signed)
°*  STAT* If patient is at the pharmacy, call can be transferred to refill team.   1. Which medications need to be refilled? (please list name of each medication and dose if known)  Carvedilol  Atorvastatin  Clopidogrel  Hydrochlorothiazide   Enalapril   2. Which pharmacy/location (including street and city if local pharmacy) is medication to be sent to? [facility]   3. Do they need a 30 day or 90 day supply? 90 day

## 2018-07-20 NOTE — Telephone Encounter (Signed)
All refills sent to [facility] for 90 Days
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
## 2019-07-11 ENCOUNTER — Telehealth: Payer: Self-pay

## 2019-07-11 MED ORDER — CLOPIDOGREL BISULFATE 75 MG PO TABS: 75.0000 mg | ORAL_TABLET | Freq: Every day | ORAL | 0 refills | Status: DC

## 2019-07-11 MED ORDER — CARVEDILOL 3.125 MG PO TABS: 3.1250 mg | ORAL_TABLET | Freq: Two times a day (BID) | ORAL | 0 refills | Status: DC

## 2019-07-11 MED ORDER — ENALAPRIL MALEATE 20 MG PO TABS: 20.0000 mg | ORAL_TABLET | Freq: Every day | ORAL | 0 refills | Status: DC

## 2019-07-11 MED ORDER — ATORVASTATIN CALCIUM 80 MG PO TABS: 80.0000 mg | ORAL_TABLET | Freq: Every day | ORAL | 0 refills | Status: DC

## 2019-07-11 MED ORDER — HYDROCHLOROTHIAZIDE 25 MG PO TABS: 25.0000 mg | ORAL_TABLET | Freq: Every day | ORAL | 0 refills | Status: DC

## 2019-07-11 NOTE — Telephone Encounter (Signed)
Requested Prescriptions   Signed Prescriptions Disp Refills  . enalapril (VASOTEC) 20 MG tablet 30 tablet 0    Sig: Take 1 tablet (20 mg total) by mouth daily. *NEEDS OFFICE VISIT FOR FURTHER REFILLS*    Authorizing Provider: Minna Merritts    Ordering User: NEWCOMER MCCLAIN, Mendi Constable L  . atorvastatin (LIPITOR) 80 MG tablet 30 tablet 0    Sig: Take 1 tablet (80 mg total) by mouth daily at 6 PM. *NEEDS OFFICE VISIT FOR FURTHER REFILLS*    Authorizing Provider: Minna Merritts    Ordering User: NEWCOMER MCCLAIN, Haisley Arens L  . clopidogrel (PLAVIX) 75 MG tablet 30 tablet 0    Sig: Take 1 tablet (75 mg total) by mouth daily with breakfast. *NEEDS OFFICE VISIT FOR FURTHER REFILLS*    Authorizing Provider: Minna Merritts    Ordering User: NEWCOMER MCCLAIN, Lyfe Reihl L  . hydrochlorothiazide (HYDRODIURIL) 25 MG tablet 30 tablet 0    Sig: Take 1 tablet (25 mg total) by mouth daily. *NEEDS OFFICE VISIT FOR FURTHER REFILLS*    Authorizing Provider: Minna Merritts    Ordering User: NEWCOMER MCCLAIN, Kamrie Fanton L  . carvedilol (COREG) 3.125 MG tablet 60 tablet 0    Sig: Take 1 tablet (3.125 mg total) by mouth 2 (two) times daily. *NEEDS OFFICE VISIT FOR FURTHER REFILLS*    Authorizing Provider: Minna Merritts    Ordering User: Raelene Bott, Ebin Palazzi L

## 2019-11-01 ENCOUNTER — Other Ambulatory Visit: Payer: Self-pay

## 2019-11-01 ENCOUNTER — Encounter: Payer: Self-pay | Admitting: Physician Assistant

## 2019-11-01 ENCOUNTER — Ambulatory Visit (INDEPENDENT_AMBULATORY_CARE_PROVIDER_SITE_OTHER): Payer: Self-pay | Admitting: Physician Assistant

## 2019-11-01 VITALS — BP 190/114 | HR 72 | Ht 74.0 in | Wt 290.5 lb

## 2019-11-01 DIAGNOSIS — J90 Pleural effusion, not elsewhere classified: Secondary | ICD-10-CM

## 2019-11-01 DIAGNOSIS — I10 Essential (primary) hypertension: Secondary | ICD-10-CM

## 2019-11-01 DIAGNOSIS — Z9861 Coronary angioplasty status: Secondary | ICD-10-CM

## 2019-11-01 DIAGNOSIS — F419 Anxiety disorder, unspecified: Secondary | ICD-10-CM

## 2019-11-01 DIAGNOSIS — Z8249 Family history of ischemic heart disease and other diseases of the circulatory system: Secondary | ICD-10-CM

## 2019-11-01 DIAGNOSIS — Z79899 Other long term (current) drug therapy: Secondary | ICD-10-CM

## 2019-11-01 DIAGNOSIS — I472 Ventricular tachycardia: Secondary | ICD-10-CM

## 2019-11-01 DIAGNOSIS — I2510 Atherosclerotic heart disease of native coronary artery without angina pectoris: Secondary | ICD-10-CM

## 2019-11-01 DIAGNOSIS — I252 Old myocardial infarction: Secondary | ICD-10-CM

## 2019-11-01 DIAGNOSIS — E785 Hyperlipidemia, unspecified: Secondary | ICD-10-CM

## 2019-11-01 DIAGNOSIS — I5033 Acute on chronic diastolic (congestive) heart failure: Secondary | ICD-10-CM

## 2019-11-01 DIAGNOSIS — F17200 Nicotine dependence, unspecified, uncomplicated: Secondary | ICD-10-CM

## 2019-11-01 DIAGNOSIS — I4729 Other ventricular tachycardia: Secondary | ICD-10-CM

## 2019-11-01 MED ORDER — HYDROCHLOROTHIAZIDE 50 MG PO TABS: 50.0000 mg | ORAL_TABLET | Freq: Every day | ORAL | 6 refills | Status: DC

## 2019-11-01 MED ORDER — CARVEDILOL 3.125 MG PO TABS: 3.1250 mg | ORAL_TABLET | Freq: Two times a day (BID) | ORAL | 6 refills | Status: DC

## 2019-11-01 NOTE — Progress Notes (Signed)
Office Visit    Patient Name: Nathaniel Chandler Date of Encounter: 11/01/2019  Primary Care Provider:  Martie Round, NP Primary Cardiologist:  Julien Nordmann, MD  Chief Complaint    49 year old male with history of CAD s/p recent non-ST elevation MI and PCI of the RCA, hypertension, hyperlipidemia, tobacco use, and family history of premature CAD, barriers to treatment including cost limitations, and who presents for follow-up today due to poorly controlled hypertension.  Past Medical History    Past Medical History:  Diagnosis Date   CAD (coronary artery disease)    a. 02/2018 NSTEMI/PCI: LM nl, LAD mild prox dzs, RI large/nl, LCX nl, OM1/2 nl, RCA nondom, 90d (2.5x12 Resolute Onyx DES), RPAV 100, EF 55-65%.   Essential hypertension    Hyperlipidemia LDL goal <70    Tobacco abuse    Past Surgical History:  Procedure Laterality Date   APPENDECTOMY     CORONARY STENT INTERVENTION N/A 03/26/2018   Procedure: CORONARY STENT INTERVENTION;  Surgeon: Marcina Millard, MD;  Location: [facility] INVASIVE CV LAB;  Service: Cardiovascular;  Laterality: N/A;   LEFT HEART CATH AND CORONARY ANGIOGRAPHY N/A 03/26/2018   Procedure: LEFT HEART CATH AND CORONARY ANGIOGRAPHY poss PCI;  Surgeon: Antonieta Iba, MD;  Location: [facility] INVASIVE CV LAB;  Service: Cardiovascular;  Laterality: N/A;    Allergies  Allergies  Allergen Reactions   Sulfa Antibiotics Anaphylaxis   Latex Rash    History of Present Illness    49 year old male with PMH as above and including history of CAD 02/2018 non-ST elevation MI with PCI to the RCA, hyperlipidemia, poorly controlled blood pressure, current tobacco use, and poorly controlled blood pressure with family history of premature CAD.  He reports a family history of Afib today. In late July 2019, he was admitted to [facility] with substernal chest pain and ruled in for non-ST elevation MI with troponin peaking at 5.4.  He underwent LHC that showed severe distal  RCA stenosis with an occlusion of the RAPV.  He underwent PCI to the dRCA.  He had normal LVSF.  He was discharged on ASA, statin, Plavix, beta-blocker, and ACE inhibitor.  He was seen in clinic 04/06/2018 and was noted to be markedly hypertensive.  He was placed on HCTZ with plan for early follow-up.  He does not check his blood pressure but reported blood pressure with SBP into the 200s for at least a decade.  BP in clinic 202/123.  Recheck after talking with the patient for some time showed BP 180/116.  He reported medication compliance; however, he also reported that he felt he was on too much medication.  He was smoking 1 pack of cigarettes daily.  He discussed financial limitations to his medical treatment plan and did not plan on enrolling in cardiac rehabilitation.  Given his financial barriers, he preferred to work on his current medications before adding any additional medications; therefore, enalapril was increased to 20 mg and HCTZ to 25 mg.  He was switched to carvedilol 3.125 mg twice daily.  Repeat BMET was ordered.  The prescriptions were sent to [facility] clinic.  Amlodipine 5 mg was recommended if BP elevated at RTC.  Repeat lipid and liver function was also recommended, given his start of high intensity statin with last LDL 72.  He was seen at follow-up by his primary cardiologist and reportedly not doing well.  He felt tired and was not sleeping at night.  He also reported lots of stress concerning his finances.  He did not have a blood pressure cuff at home.  It was noted amlodipine could be added at his next clinic visit.  BP 154/100.  No medication changes were made.  He presents to clinic today with initial blood pressure 190/114 and 160/98 on recheck.  He reports that he has been taking his medications as prescribed.  He states his PCP at [facility] clinic increased his Coreg from 3.125 mg twice daily to 6.25 mg twice daily, and he has not reacted well to this change.  He states that he wishes to go  back down to his original Coreg 3.125mg  BID dose.  He denies any chest pain; however, he does report racing heart rate that he attributes to anxiety and deconditioning.  No palpitations.  He notes dyspnea, which he attributes to deconditioning. He denies any SOB at rest unless wearing a mask. He notes intermittent presyncope but no syncope.  He denies any orthopnea but does report PND and insomnia.  He denies any abdominal distention, lower extremity edema, or early satiety.  We does note weight gain that he attributes to a sedentary lifestyle. He reports significant stressors in his life, including paying child support and being on disability.  He reports significant anxiety, especially since stopping his Xanax and associated with his medical care.  He does not have a regular walking program.  He is not checking his blood pressure at home.  He has not made any dietary changes.  He has not cut back on cigarettes and is still smoking 1 pack a day.  No alcohol use.  He reports that he is drinking [facility] with recommendation to discontinue.  He is not using ibuprofen. He continues to report financial difficulties with today's clinic EKG deferred / refused per patient due to cost out of pocket.  He is hesitant to make any medication changes today due to cost but agreeable to change his current medications that he is on and then look at trying new medications, such as amlodipine or hydralazine.  He indicates that he is unable to afford the labs if done at [facility] and thus needs for the labs to be sent to [place] clinic.  Home Medications    Prior to Admission medications   Medication Sig Start Date End Date Taking? Authorizing Provider  acetaminophen (TYLENOL) 325 MG tablet Take 2 tablets (650 mg total) by mouth every 6 (six) hours as needed for mild pain (or Fever >/= 101). 03/27/18  Yes Gouru, Deanna Artis, MD  aspirin 81 MG EC tablet Take 1 tablet (81 mg total) by mouth daily. 04/23/18  Yes Creig Hines, NP   atorvastatin (LIPITOR) 80 MG tablet Take 1 tablet (80 mg total) by mouth daily at 6 PM. *NEEDS OFFICE VISIT FOR FURTHER REFILLS* 07/11/19  Yes Gollan, Tollie Pizza, MD  carvedilol (COREG) 6.25 MG tablet Take 6.25 mg by mouth 2 (two) times daily with a meal.   Yes [provider]  clopidogrel (PLAVIX) 75 MG tablet Take 1 tablet (75 mg total) by mouth daily with breakfast. *NEEDS OFFICE VISIT FOR FURTHER REFILLS* 07/11/19  Yes Antonieta Iba, MD  enalapril (VASOTEC) 20 MG tablet Take 1 tablet (20 mg total) by mouth daily. *NEEDS OFFICE VISIT FOR FURTHER REFILLS* 07/11/19  Yes Gollan, Tollie Pizza, MD  hydrochlorothiazide (HYDRODIURIL) 25 MG tablet Take 1 tablet (25 mg total) by mouth daily. *NEEDS OFFICE VISIT FOR FURTHER REFILLS* 07/11/19  Yes Gollan, Tollie Pizza, MD  Misc Natural Products (TART CHERRY ADVANCED PO) Take  1,000 capsules by mouth in the morning and at bedtime.   Yes [provider]    Review of Systems    He denies chest pain, palpitations, orthopnea, n, v, syncope, edema, or early satiety.He reports DOE, attributed to deconditioning. He notes presyncope / dizziness. He reports weight gain, attributed to sedentary lifestyle. He reports fatigue and racing HR, as well as significant anxiety and stressors. He notes PND and insomnia. He notes agitation.  All other systems reviewed and are otherwise negative except as noted above.  Physical Exam    VS:  BP (!) 190/114 (BP Location: Left Arm, Patient Position: Sitting, Cuff Size: Large)    Pulse 72    Ht 6\' 2"  (1.88 m)    Wt 290 lb 8 oz (131.8 kg)    SpO2 99%    BMI 37.30 kg/m  , BMI Body mass index is 37.3 kg/m. GEN: Well nourished, well developed, in no acute distress. HEENT: normal. Neck: Supple, JVD difficult to assess 2/2 body habitus. No carotid bruits or masses. Cardiac: RRR, 1/6 systolic murmur. No rubs or gallops. No clubbing, cyanosis, edema.  Radials/DP/PT 2+ and equal bilaterally.  Respiratory:  Respirations  regular and unlabored, no left lower and mid lung sounds (suggestive of worsening LLL pleural effusion as seen on previous CXR). GI: Soft, nontender, nondistended, BS + x 4. MS: no deformity or atrophy. Skin: warm and dry, no rash. Neuro:  Strength and sensation are intact. Psych: Normal affect.  Accessory Clinical Findings    ECG personally reviewed by me today - Refused 2/2 cost today - no acute changes.  VITALS Reviewed   Temp Readings from Last 3 Encounters:  03/27/18 98.8 F (37.1 C) (Oral)  10/14/16 98.8 F (37.1 C) (Oral)   BP Readings from Last 3 Encounters:  11/01/19 (!) 190/114  05/22/18 (!) 154/100  04/19/18 (!) 180/116   Pulse Readings from Last 3 Encounters:  11/01/19 72  05/22/18 69  04/19/18 65    Wt Readings from Last 3 Encounters:  11/01/19 290 lb 8 oz (131.8 kg)  05/22/18 267 lb (121.1 kg)  04/19/18 264 lb (119.7 kg)     LABS  reviewed   CareEverwhere Labs present? Yes/No: No  Lab Results  Component Value Date   WBC 13.4 (H) 03/27/2018   HGB 14.0 03/27/2018   HCT 41.4 03/27/2018   MCV 83.2 03/27/2018   PLT 269 03/27/2018   Lab Results  Component Value Date   CREATININE 1.17 04/19/2018   BUN 13 04/19/2018   NA 140 04/19/2018   K 4.7 04/19/2018   CL 98 04/19/2018   CO2 28 04/19/2018   No results found for: ALT, AST, GGT, ALKPHOS, BILITOT Lab Results  Component Value Date   CHOL 124 03/27/2018   HDL 31 (L) 03/27/2018   LDLCALC 72 03/27/2018   TRIG 104 03/27/2018   CHOLHDL 4.0 03/27/2018    Lab Results  Component Value Date   HGBA1C 5.6 03/26/2018   Lab Results  Component Value Date   TSH 1.500 03/26/2018     STUDIES/PROCEDURES reviewed    PCI 02/2018  Post Atrio lesion is 100% stenosed.  Dist RCA lesion is 90% stenosed.  A drug-eluting stent was successfully placed using a STENT RESOLUTE ONYX 2.5X12.  Post intervention, there is a 0% residual stenosis. 1.  One-vessel CAD with occluded posterior lateral branch, and 95%  stenosis distal RCA 2.  Successful PCI with DES distal RCA  LHC 02/2018  Mid RCA lesion is 80%  stenosed.  Dist LM to Ost LAD lesion is 20% stenosed.  The left ventricular ejection fraction is 55-65% by visual estimate.  There is no aortic valve stenosis.  There is no mitral valve stenosis and no mitral valve regurgitation.  There is no aortic valve regurgitation.  CXR 02/2018 IMPRESSION: 1. Small LEFT pleural effusion versus pleural thickening with LEFT lung base atelectasis/scarring. 2. Mild cardiomegaly.   Assessment & Plan    Coronary artery disease s/p PCI History of NSTEMI Family history of CAD --No CP today. S/p 02/2018 non-ST elevation MI and PCI to dRCA.  Previous cath and PCI as above.  Known residual occluded small RPAV. Normal LVSF by LV gram. He did not complete cardiac rehab due to cost. Current medical management / treatment / labs / procedures limited by financial barriers.  Unable to perform EKG today per patient decision and due to cost.  When cost allows, recommend obtain EKG and an echo.  Also recommend repeat lipid and liver function, as he was started on high intensity statin with repeat lipid and liver not yet collected. If lipids elevated at recheck, add Zetia. His blood pressure is elevated today, and he is volume up. He prefers to avoid new medications.  We have agreed therefore to increase his HCTZ as below with follow-up labs collected via [place] [facility], as he is unable to afford the labs collected at [facility].   Continue ASA, statin, Coreg, Plavix, and ACE inhibitor.  Due to side effects reported with the Coreg at 6.25 mg, he requests to decrease back down to 3.125 mg. Recommend he monitor his BP closely at home given this decrease, as we may need to add amlodipine as below. Long discussion regarding risk factor modification, including smoking cessation and dietary changes.  Uncontrolled hypertension --BP markedly elevated at arrival and 190/114.  At the end of  appointment, BP improved and 160/98.  Due to elevated blood pressure and as he is slightly volume up, we agreed to increase HCTZ to 50 mg daily.  He agrees to get labs for BMET/Mg today and in 1 week via [place] clinic, as he cannot afford them through [facility].  Ideally, I would also start him on amlodipine 5 mg daily today, as his blood pressure appears to be poorly controlled for some time now.  However, given his financial barriers, we will instead first try to work with his current medications as his renal function and electrolytes allow.  Recommend decrease caffeine /stop drinking [facility], start a low salt diet, monitor fluid intake, engage in regular exercise, start BP home checks / log. Discussed these recommendations with patient.  Continue medical management as above with increased HCTZ and consider amlodipine at RTC. Goal BP <130/80.  Acute on chronic HFpEF --Volume up on exam.  No previous echo but LVSF normal by 2019 cath with echo recommended when cost allows. Will increase HCTZ with close monitoring of renal function as above given his most recent creatinine as above.  Labs (BMET/Mg) deferred to [place] clinic 2/2 cost as above.  No lung sounds from mid left lung down to base of lung, and previous chest x-ray as above showed small left pleural effusion. I suspect that this effusion has thus increased since that time.  Discussed the importance of repeat imaging, as he may need a thoracentesis.  He wishes to defer due to cost.  Reassess at RTC.  Also, reassess obtaining an echo at RTC.  Recommend low-salt diet and restriction of fluids.  Continue ACE  inhibitor.  Left pleural effusion --As above, suspect left pleural effusion has worsened from previous chest x-ray due to reduced breath sounds from approximately mid to lower left lung.  Discussed this at length with the patient.  Recommend echo/repeat chest x-ray as above.  HLD --LDL 72 at start of statin.  He is due for repeat lipid and liver  function on high intensity statin; however, we will defer at this time per patient and due to financial barriers.  Recommend fasting lipid and liver function at RTC or sooner if possible.  If LDL remains uncontrolled on recheck, add Zetia if cost allows. Goal LDL <70.  H/o NSVT --Reassess if finances allow for Zio at RTC and given report of racing HR and intermittent dizziness. Recommend TSH once finances allow. Will check BMET/Mg. TSH and Zio deferred at this time due to cost. Continue BB.  Anxiety --He reports today that a significant amount of his elevated blood pressure is connected to anxiety and agitation with repeat BP significantly improved once he has calm down during the visit.  Instructed him to speak with his PCP regarding his Xanax prescription, as I wonder how much of his blood pressure is connected to his stress level and current financial difficulties.  Tobacco use --Continued to smoke 1 pack daily.  Recommend cessation. He does not wish to start Wellbutrin or Chantix. Call 1-800-QUIT-NOW [phone])277-3757) for help with quitting smoking.   Financial barriers --Long discussion regarding financial barriers to determine best plan of care to allow for compliance.   Medication changes: Increase HCTZ to 50mg  daily. Labs ordered: BMET/Mg Studies / Imaging ordered: None due to cost Recommended future studies: EKG, Echo, CXR (LLL effusion), Zio Disposition: RTC 1 month.  Total time spent with patient today 45 minutes. This includes reviewing records, evaluating the patient, and coordinating care. Face-to-face time >50%.    , PA-C 11/01/2019, 11:16 AM

## 2019-11-01 NOTE — Patient Instructions (Signed)
Medication Instructions:  - Your physician has recommended you make the following change in your medication:   1) Increase HCTZ up to 50 mg- take 1 tablet by mouth once daily  2) Decrease Coreg (Carvedilol) to 3.125 mg- take 1 tablet by mouth twice daily  *If you need a refill on your cardiac medications before your next appointment, please call your pharmacy*   Lab Work: - Your physician recommends that you have lab work now: BMP/ Magnesium (orders given for [facility])  - Your physician recommends that you return for lab work in: 1 week- BMP/ Magnesium (orders given for [facility])  If you have labs (blood work) drawn today and your tests are completely normal, you will receive your results only by: Marland Kitchen MyChart Message (if you have MyChart) OR . A paper copy in the mail If you have any lab test that is abnormal or we need to change your treatment, we will call you to review the results.   Testing/Procedures: - none ordered   Follow-Up: At [facility], you and your health needs are our priority.  As part of our continuing mission to provide you with exceptional heart care, we have created designated Provider Care Teams.  These Care Teams include your primary Cardiologist (physician) and Advanced Practice Providers (APPs -  Physician Assistants and Nurse Practitioners) who all work together to provide you with the care you need, when you need it.  We recommend signing up for the patient portal called "MyChart".  Sign up information is provided on this After Visit Summary.  MyChart is used to connect with patients for Virtual Visits (Telemedicine).  Patients are able to view lab/test results, encounter notes, upcoming appointments, etc.  Non-urgent messages can be sent to your provider as well.   To learn more about what you can do with MyChart, go to [email or web address].    Your next appointment:   1 month(s)  The format for your next appointment:   In Person  Provider:     You may see Julien Nordmann, MD or one of the following Advanced Practice Providers on your designated Care Team:    Nicolasa Ducking, NP  Eula Listen, PA-C  Marisue Ivan, PA-C    Other Instructions -n/a

## 2019-11-06 ENCOUNTER — Encounter: Payer: Self-pay | Admitting: Cardiovascular Disease

## 2019-11-26 ENCOUNTER — Encounter: Payer: Self-pay | Admitting: Cardiovascular Disease

## 2019-12-03 ENCOUNTER — Telehealth: Payer: Self-pay | Admitting: Cardiovascular Disease

## 2019-12-03 NOTE — Telephone Encounter (Signed)
Patient calling to discuss recent testing results   Please call and also advise if cancelled appt is needed asap or if it can wait which is patient preference.

## 2019-12-03 NOTE — Telephone Encounter (Signed)
Patient returning call.

## 2019-12-03 NOTE — Telephone Encounter (Signed)
Left voicemail message to call back  

## 2019-12-03 NOTE — Telephone Encounter (Signed)
Spoke with patient and he wanted to follow up on lab work before and after medication changes. We do have the labs from 11/06/19 and he states they also did them again in 11/26/19. Inquired if he could call them to fax [place] the second test results and he was agreeable to this. He will reach out to them tomorrow and then I will forward to provider. Patient is self pay and he is not able to come in for appointment if not needed. Advised that once I get results I can forward to Dr. Mariah Milling for review and recommendations. He verbalized understanding with no further questions at this time.

## 2019-12-04 ENCOUNTER — Ambulatory Visit: Payer: Medicaid Other | Admitting: Cardiovascular Disease

## 2019-12-04 ENCOUNTER — Ambulatory Visit: Payer: Self-pay | Admitting: Cardiovascular Disease

## 2019-12-05 NOTE — Telephone Encounter (Signed)
Labs fine Can he call us with some BP numbers Was elevated on last clinic visit

## 2019-12-05 NOTE — Telephone Encounter (Signed)
Labs received from before and after medication changes. He wanted you to review to make sure all was good or for further recommendations.

## 2019-12-06 NOTE — Telephone Encounter (Signed)
Call attempted. No answer, no vm. 

## 2019-12-09 NOTE — Telephone Encounter (Signed)
Spoke with patient and reviewed provider recommendations. Reviewed that he would need to check blood pressures 2 hours after medications. Instructed him to monitor blood pressures and call us in one week with those readings. He verbalized understanding of our conversation, agreement with plan, and had no further questions.

## 2019-12-16 ENCOUNTER — Telehealth: Payer: Self-pay | Admitting: Cardiovascular Disease

## 2019-12-16 NOTE — Telephone Encounter (Signed)
Please would like a letter stating he is unable to work stating his heart condition. Please call to discuss.

## 2019-12-16 NOTE — Telephone Encounter (Signed)
Patient called in wanting a letter stating that he is at high risk for COVID due to current health conditions and elevated blood pressures. He states last provider did state he should not return to work and so he needs letter just in case court requests documentation regarding his child support case. Advised that we do not write letters for COVID but will have to check with provider regarding work release. I do not see it mentioned in her notes but that I would forward to his provider for review and I did advise he may need to get this from his PCP. He verbalized understanding of our conversation, agreement with plan, and had no further questions at this time.

## 2019-12-18 NOTE — Telephone Encounter (Signed)
We do not write this kind of letters If he needs disability he needs to go through that agency Would recommend he get the Covid vaccine which is available to everybody His prior stenting and high blood pressure does not make him more susceptible to Covid Would recommend he wear a mask

## 2019-12-19 NOTE — Telephone Encounter (Signed)
I did not advise that he not return back to work.   If he is currently quarantining due to a positive COVID-19 test from an outside provider, I recommend he contact that provider for a work note.   Thank you.

## 2019-12-19 NOTE — Telephone Encounter (Signed)
Spoke with patient and reviewed providers reply regarding letter for COVID and he verbalized understanding with no further questions at this time.

## 2019-12-19 NOTE — Telephone Encounter (Signed)
Spoke with patient and reviewed provider information. He states that this letter request is not for disability. He saw Ferol Luz and that when he asked her about going to work she "almost came out of her jacket" stating that he should not return to work because he is highly susceptible to COVID. Advised that I would send this to her for review but that currently our policy is not to write letters for COVID. He requested for her review and tried to explain that we most likely may not be able to assist. He again stated that he asked if he could return to work and that provider told him no. Advised that I would send message to her for review. He was told that he should not work due to his situation. He needs note stating that he is highly susceptible and unable to work. Because multiple providers have told him not to work he did file for unemployment but needs letter to reflect this so he doesn't have further complications. Again reviewed that I would send to provider.

## 2020-05-11 ENCOUNTER — Other Ambulatory Visit: Payer: Self-pay | Admitting: Cardiovascular Disease

## 2020-05-11 MED ORDER — CARVEDILOL 3.125 MG PO TABS: 3.1250 mg | ORAL_TABLET | Freq: Two times a day (BID) | ORAL | 3 refills | Status: DC

## 2020-05-11 MED ORDER — ENALAPRIL MALEATE 20 MG PO TABS
20.0000 mg | ORAL_TABLET | Freq: Every day | ORAL | 3 refills | Status: DC
Start: 2020-05-11 — End: 2023-07-03

## 2020-05-11 MED ORDER — CLOPIDOGREL BISULFATE 75 MG PO TABS: 75.0000 mg | ORAL_TABLET | Freq: Every day | ORAL | 0 refills | Status: DC

## 2020-05-11 MED ORDER — ENALAPRIL MALEATE 20 MG PO TABS: 20.0000 mg | ORAL_TABLET | Freq: Every day | ORAL | 0 refills | Status: DC

## 2020-05-11 MED ORDER — ATORVASTATIN CALCIUM 80 MG PO TABS: 80.0000 mg | ORAL_TABLET | Freq: Every day | ORAL | 0 refills | Status: DC

## 2020-05-11 MED ORDER — CLOPIDOGREL BISULFATE 75 MG PO TABS: 75.0000 mg | ORAL_TABLET | Freq: Every day | ORAL | 3 refills | Status: DC

## 2020-05-11 MED ORDER — CARVEDILOL 3.125 MG PO TABS: 3.1250 mg | ORAL_TABLET | Freq: Two times a day (BID) | ORAL | 6 refills | Status: DC

## 2020-05-11 MED ORDER — HYDROCHLOROTHIAZIDE 50 MG PO TABS
50.0000 mg | ORAL_TABLET | Freq: Every day | ORAL | 3 refills | Status: DC
Start: 2020-05-11 — End: 2023-07-03

## 2020-05-11 MED ORDER — HYDROCHLOROTHIAZIDE 50 MG PO TABS: 50.0000 mg | ORAL_TABLET | Freq: Every day | ORAL | 6 refills | Status: DC

## 2020-05-11 MED ORDER — ATORVASTATIN CALCIUM 80 MG PO TABS: 80.0000 mg | ORAL_TABLET | Freq: Every day | ORAL | 3 refills | Status: DC

## 2020-05-11 NOTE — Telephone Encounter (Signed)
Patient declined fu during AM conversation and states he is only going to come once a year .  He only came in march for refills and these were not sent .    Fwd to Clinic to discuss with patient if needed to provide refills.

## 2020-05-11 NOTE — Telephone Encounter (Signed)
Patient is very aggravated with our office and is not understanding why he needs to follow up from his March 2021 visit with [facility] and needs an explanation on his medical condition as to why he needs to be seen before his medications are filled. He financial can't come in and needs some help on what he needs to do to get his medications filled.

## 2020-05-11 NOTE — Telephone Encounter (Signed)
Please schedule F/U appointment. Thank you! 

## 2020-05-11 NOTE — Telephone Encounter (Signed)
Call attempted. No answer, no vm. 

## 2020-05-11 NOTE — Telephone Encounter (Signed)
*  STAT* If patient is at the pharmacy, call can be transferred to refill team.   1. Which medications need to be refilled? (please list name of each medication and dose if known)   Carvedilol 3.125 mg po BID  Atorvastatin 80 mg po q d  hctz 50 mg po q d  Clopidogrel 75 mg po q d  Enalapril 20 mg po  ASA 81 mg po q d   2. Which pharmacy/location (including street and city if local pharmacy) is medication to be sent to?  [facility]   3. Do they need a 30 day or 90 day supply? Asked for 1 year supply to be sent in march but this was not done .  Patient would like for this to be done today as he is out of meds.   Please call if this is not going to be done .

## 2020-05-11 NOTE — Telephone Encounter (Signed)
Made him aware that I would send in refills and I asked if he would please take 1 week of BP readings and send those results to [place].   Pt agreed to POC. I also gave him information on social work helping with appts. He declined appt at this time and said that he will only come 1 x per year.   Pt agrees to keep [place] in the loop and will call with any new and concerning sx.

## 2020-07-19 IMAGING — DX DG CHEST 1V PORT
1 series · 2 of 2 positions shown · non-contrast
Comparison: None.

CLINICAL DATA: LEFT chest pain beginning at 7 p.m.. History of
hypertension.

EXAM:
PORTABLE CHEST 1 VIEW

[Series 1: chest ap · 0.14mm/px · 2 of 2 slices shown]
[im 1/2]
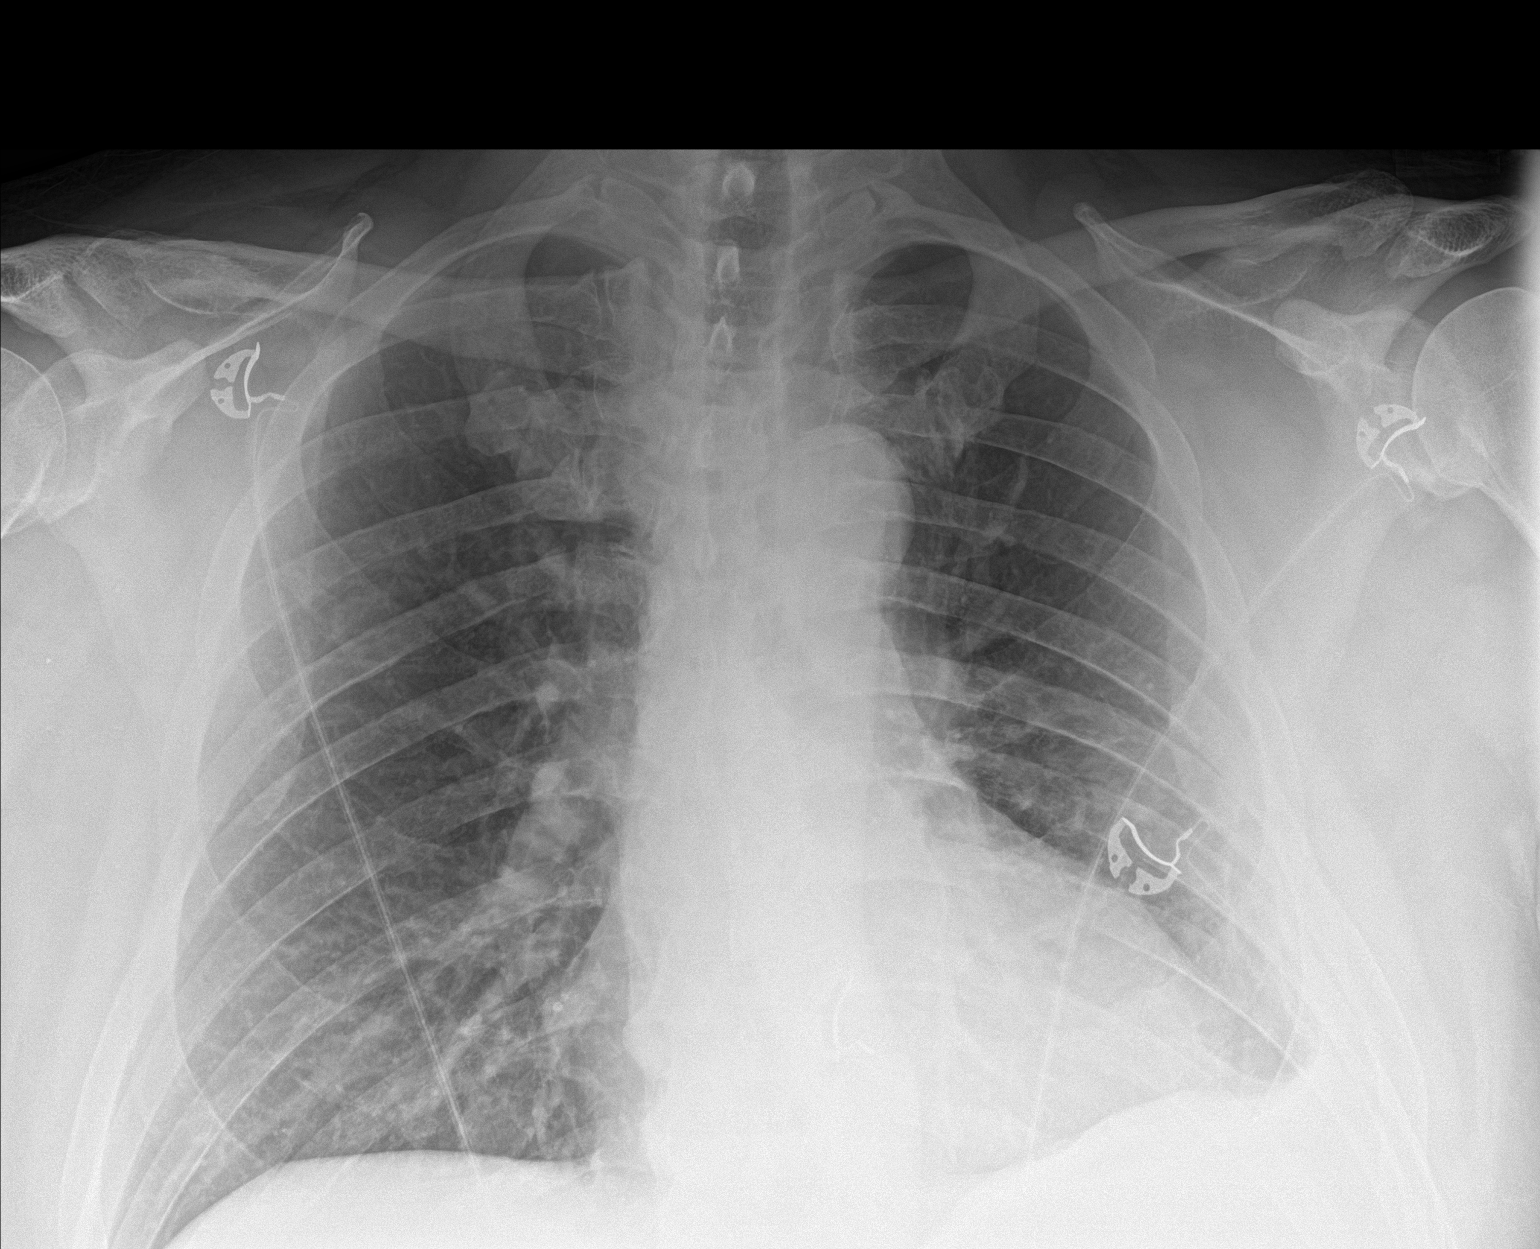
[im 2/2]
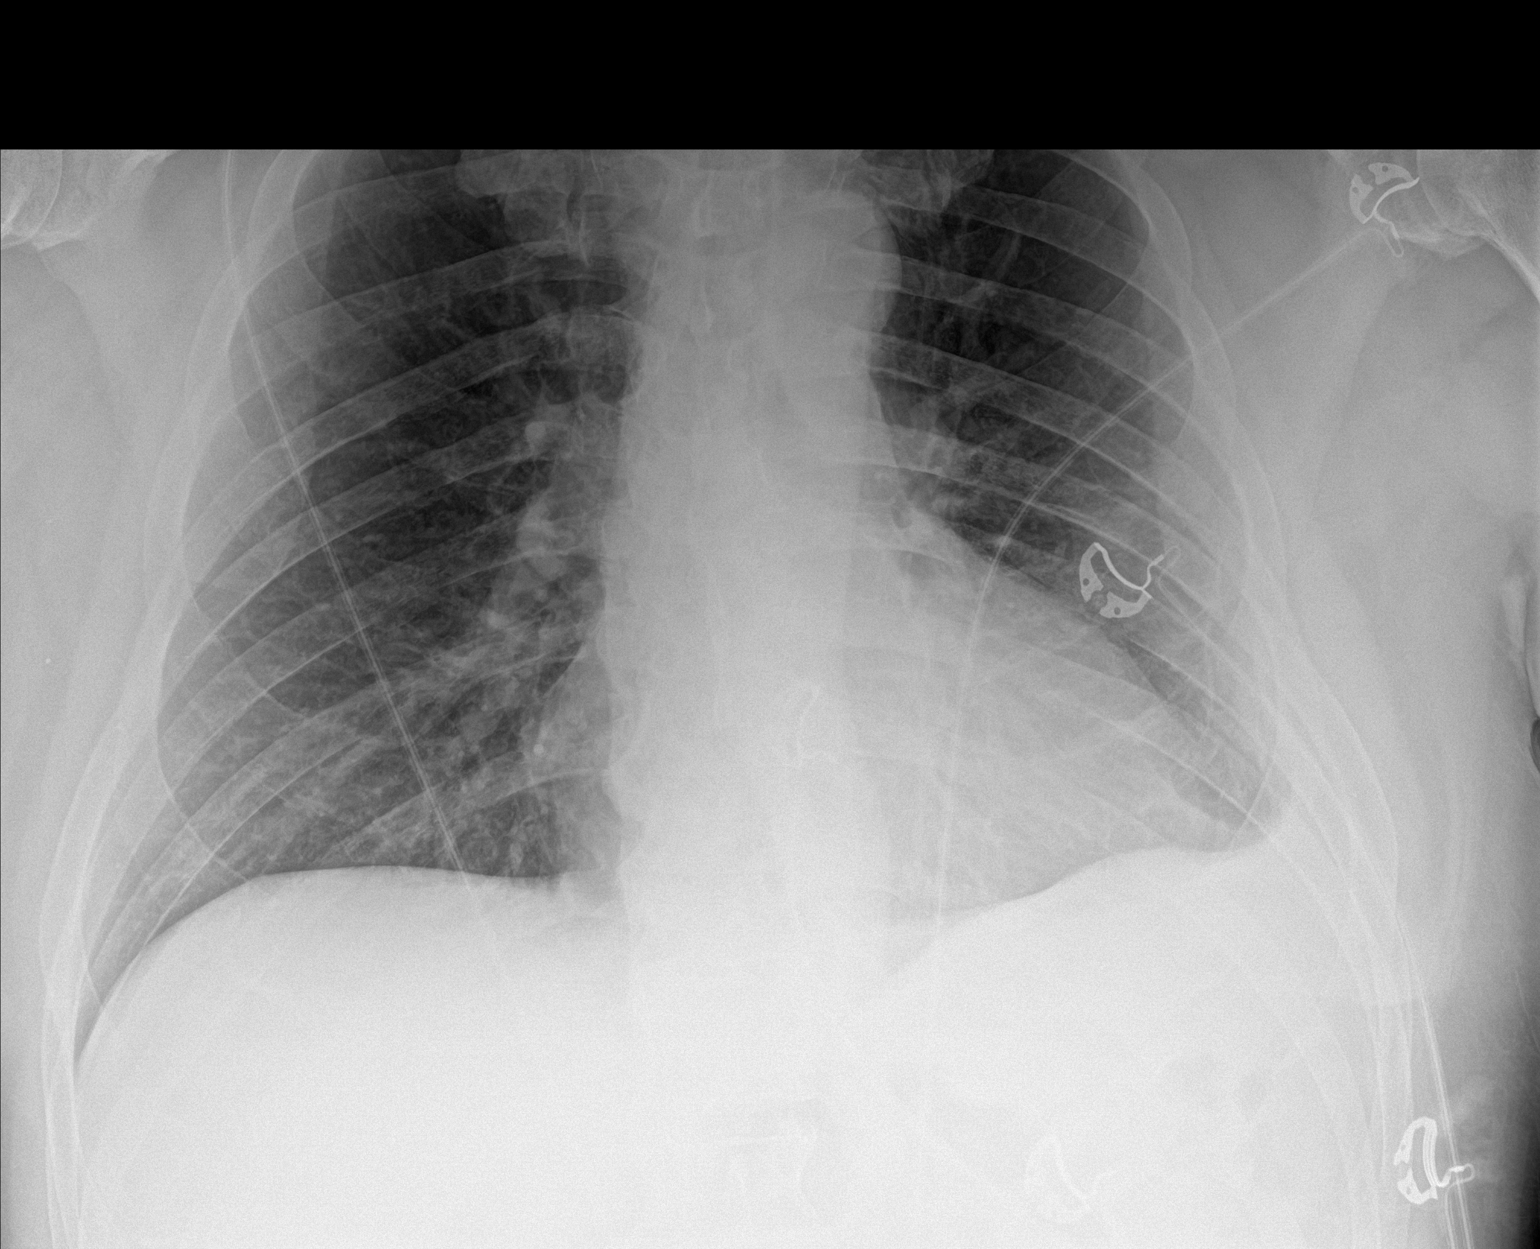

[2 of 2 positions shown; findings below may reference images not displayed]

FINDINGS: Cardiac silhouette is mildly enlarged. Mediastinal silhouette is not
suspicious. Blunting of the LEFT costophrenic angle with LEFT lung
base strandy densities. No pneumothorax. Soft tissue planes and
included osseous structures are non suspicious.
IMPRESSION: 1. Small LEFT pleural effusion versus pleural thickening with LEFT
lung base atelectasis/scarring.
2. Mild cardiomegaly.

## 2023-04-03 DIAGNOSIS — I25118 Atherosclerotic heart disease of native coronary artery with other forms of angina pectoris: Secondary | ICD-10-CM | POA: Diagnosis not present

## 2023-04-03 DIAGNOSIS — Z0131 Encounter for examination of blood pressure with abnormal findings: Secondary | ICD-10-CM | POA: Diagnosis not present

## 2023-04-03 DIAGNOSIS — E1165 Type 2 diabetes mellitus with hyperglycemia: Secondary | ICD-10-CM | POA: Diagnosis not present

## 2023-04-03 DIAGNOSIS — Z1389 Encounter for screening for other disorder: Secondary | ICD-10-CM | POA: Diagnosis not present

## 2023-04-03 DIAGNOSIS — I10 Essential (primary) hypertension: Secondary | ICD-10-CM | POA: Diagnosis not present

## 2023-04-03 DIAGNOSIS — F321 Major depressive disorder, single episode, moderate: Secondary | ICD-10-CM | POA: Diagnosis not present

## 2023-04-03 DIAGNOSIS — I252 Old myocardial infarction: Secondary | ICD-10-CM | POA: Diagnosis not present

## 2023-06-26 ENCOUNTER — Telehealth: Payer: Self-pay | Admitting: *Deleted

## 2023-06-26 NOTE — Telephone Encounter (Signed)
Lmovm to verify card hx*

## 2023-07-02 NOTE — Progress Notes (Unsigned)
Cardiology Office Note  Date:  07/03/2023   ID:  Chandler, Nathaniel [birth year], MRN [id number]  PCP:  Martie Round, NP   Chief Complaint  Patient presents with   New Patient (Initial Visit)    Patient was last seen in 2021. "Doing well." Medications reviewed by the patient verbally.     HPI:  Mr. Nathaniel Chandler is a 52 year old gentleman with past medical history of Smoker 1 ppd Hypertension, chronic and poorly controlled Hyperlipidemia  admitted to [facility] 03/26/18 with SSCP NSTEMI- peak troponin 5.4.   He presents for follow-up of his coronary artery disease, stent to the RCA Who presents by referral from Dr. Richarda Blade for hypertension, coronary disease  -Last seen by myself in 2019  DES to his mid to distal RCA. 02/2018 NSTEMI/PCI: LM nl, LAD mild prox dzs, RI large/nl, LCX nl, OM1/2 nl, RCA nondom, 90d (2.5x12 Resolute Onyx DES), RPAV 100, EF 55-65%.  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, gives meds to residents  EKG personally reviewed by myself on todays visit EKG Interpretation Date/Time:  Monday July 03 2023 09:51:28 EST Ventricular Rate:  72 PR Interval:  196 QRS Duration:  104 QT Interval:  380 QTC Calculation: 416 R Axis:   -11  Text Interpretation: Normal sinus rhythm Inferior infarct , age undetermined When compared with ECG of 27-Mar-2018 03:27, Inferior infarct is now Present Inverted T waves have replaced nonspecific T wave abnormality in Inferior leads Confirmed by Julien Nordmann [phone]) on 07/03/2023 10:00:09 AM    PMH:   has a past medical history of CAD (coronary artery disease), Essential hypertension, Hyperlipidemia LDL goal <70, and Tobacco abuse.  PSH:    Past Surgical  History:  Procedure Laterality Date   APPENDECTOMY     CORONARY STENT INTERVENTION N/A 03/26/2018   Procedure: CORONARY STENT INTERVENTION;  Surgeon: Marcina Millard, MD;  Location: [facility] INVASIVE CV LAB;  Service: Cardiovascular;  Laterality: N/A;   LEFT HEART CATH AND CORONARY ANGIOGRAPHY N/A 03/26/2018   Procedure: LEFT HEART CATH AND CORONARY ANGIOGRAPHY poss PCI;  Surgeon: Antonieta Iba, MD;  Location: [facility] INVASIVE CV LAB;  Service: Cardiovascular;  Laterality: N/A;    Current Outpatient Medications  Medication Sig Dispense Refill   acetaminophen (TYLENOL) 325 MG tablet Take 2 tablets (650 mg total) by mouth every 6 (six) hours as needed for mild pain (or Fever >/= 101).     aspirin 81 MG EC tablet Take 1 tablet (81 mg total) by mouth daily. 90 tablet 0   atorvastatin (LIPITOR) 40 MG tablet Take 40 mg by mouth daily.     carvedilol (COREG) 6.25 MG tablet Take 6.25 mg by mouth 2 (two) times daily with a meal.     celecoxib (CELEBREX) 100 MG capsule Take 100 mg by mouth daily.     clopidogrel (PLAVIX) 75 MG tablet Take 1 tablet (75 mg total) by mouth daily with breakfast. *NEEDS OFFICE VISIT FOR FURTHER REFILLS* 90 tablet 3   hydrochlorothiazide (HYDRODIURIL) 25 MG tablet Take 25 mg by mouth 2 (two) times daily.     JARDIANCE 10 MG TABS tablet Take 10 mg by mouth daily.     lisinopril (ZESTRIL) 20 MG tablet Take  20 mg by mouth daily.     metFORMIN (GLUCOPHAGE) 500 MG tablet Take 500 mg by mouth 2 (two) times daily.     Multiple Vitamin (MULTIVITAMIN) tablet Take 1 tablet by mouth daily.     No current facility-administered medications for this visit.    Allergies:   Sulfa antibiotics and Latex   Social History:  The patient  reports that he has been smoking cigarettes. He started smoking about 30 years ago. He has a 30.7 pack-year smoking history. He has never used smokeless tobacco. He reports current drug use. Drug: Marijuana. He reports that he does not drink alcohol.    Family History:   family history includes CAD in his brother; Congestive Heart Failure in his father and mother; Diabetes Mellitus II in his father; Heart attack in his mother; Heart disease in his mother; Hypertension in his father.    Review of Systems: Review of Systems  Constitutional: Negative.   HENT: Negative.    Respiratory: Negative.    Cardiovascular: Negative.   Gastrointestinal: Negative.   Musculoskeletal: Negative.   Neurological: Negative.   Psychiatric/Behavioral: Negative.    All other systems reviewed and are negative.   PHYSICAL EXAM: VS:  BP (!) 150/90 (BP Location: Left Arm, Patient Position: Sitting, Cuff Size: Normal)   Pulse 72   Ht 6' (1.829 m)   Wt 285 lb 8 oz (129.5 kg)   SpO2 98%   BMI 38.72 kg/m  , BMI Body mass index is 38.72 kg/m. GEN: Well nourished, well developed, in no acute distress HEENT: normal Neck: no JVD, carotid bruits, or masses Cardiac: RRR; no murmurs, rubs, or gallops,no edema  Respiratory:  clear to auscultation bilaterally, normal work of breathing GI: soft, nontender, nondistended, + BS MS: no deformity or atrophy Skin: warm and dry, no rash Neuro:  Strength and sensation are intact Psych: euthymic mood, full affect    Recent Labs: No results found for requested labs within last 365 days.    Lipid Panel Lab Results  Component Value Date   CHOL 124 03/27/2018   HDL 31 (L) 03/27/2018   LDLCALC 72 03/27/2018   TRIG 104 03/27/2018      Wt Readings from Last 3 Encounters:  07/03/23 285 lb 8 oz (129.5 kg)  11/01/19 290 lb 8 oz (131.8 kg)  05/22/18 267 lb (121.1 kg)     ASSESSMENT AND PLAN:  Problem List Items Addressed This Visit       Cardiology Problems   Uncontrolled hypertension   Relevant Medications   lisinopril (ZESTRIL) 20 MG tablet   carvedilol (COREG) 6.25 MG tablet   atorvastatin (LIPITOR) 40 MG tablet   hydrochlorothiazide (HYDRODIURIL) 25 MG tablet   Other Relevant Orders   EKG 12-Lead  (Completed)   Dyslipidemia, goal LDL below 70   Relevant Medications   lisinopril (ZESTRIL) 20 MG tablet   carvedilol (COREG) 6.25 MG tablet   atorvastatin (LIPITOR) 40 MG tablet   hydrochlorothiazide (HYDRODIURIL) 25 MG tablet     Other   Smoker   Other Visit Diagnoses     Coronary artery disease of native artery of native heart with stable angina pectoris (HCC)    -  Primary   Relevant Medications   lisinopril (ZESTRIL) 20 MG tablet   celecoxib (CELEBREX) 100 MG capsule   carvedilol (COREG) 6.25 MG tablet   atorvastatin (LIPITOR) 40 MG tablet   hydrochlorothiazide (HYDRODIURIL) 25 MG tablet   Other Relevant Orders   EKG 12-Lead (Completed)  Coronary artery disease with stable angina Currently with no symptoms of angina. No further workup at this time. Continue current medication regimen.  Hyperlipidemia Cholesterol is at goal on the current lipid regimen. No changes to the medications were made.  Essential hypertension Blood right home typically 135/80-90  Recommend he continue to monitor pressures BMP ordered today given recent start of Jardiance, also taking HCTZ 50 daily with creatinine 1.4 in August  Chronic pain/back Improved symptoms on Celebrex  Chronic renal insufficiency In the setting of diabetes creatinine 1.44, repeat BMP today  Diabetes type 2 with complications We have encouraged continued exercise, careful diet management in an effort to lose weight. On metformin and Jardiance  Patient seen in consultation for Dr.Adamo and will be referred back to his office for ongoing care of the issues detailed above  Signed, Dossie Arbour, M.D., Ph.D. [facility] [place], [place] [phone]

## 2023-07-03 ENCOUNTER — Encounter: Payer: Self-pay | Admitting: Cardiovascular Disease

## 2023-07-03 ENCOUNTER — Ambulatory Visit: Payer: Medicaid Other | Attending: Cardiovascular Disease | Admitting: Cardiovascular Disease

## 2023-07-03 VITALS — BP 150/90 | HR 72 | Ht 72.0 in | Wt 285.5 lb

## 2023-07-03 DIAGNOSIS — E785 Hyperlipidemia, unspecified: Secondary | ICD-10-CM

## 2023-07-03 DIAGNOSIS — Z79899 Other long term (current) drug therapy: Secondary | ICD-10-CM | POA: Diagnosis not present

## 2023-07-03 DIAGNOSIS — I10 Essential (primary) hypertension: Secondary | ICD-10-CM

## 2023-07-03 DIAGNOSIS — F17200 Nicotine dependence, unspecified, uncomplicated: Secondary | ICD-10-CM

## 2023-07-03 DIAGNOSIS — I25118 Atherosclerotic heart disease of native coronary artery with other forms of angina pectoris: Secondary | ICD-10-CM

## 2023-07-03 NOTE — Patient Instructions (Addendum)
Medication Instructions:  When clopidogrel runs out, ok to stop  If you need a refill on your cardiac medications before your next appointment, please call your pharmacy.   Lab work: [employer] today  Testing/Procedures: No new testing needed  Follow-Up: At [facility], you and your health needs are our priority.  As part of our continuing mission to provide you with exceptional heart care, we have created designated Provider Care Teams.  These Care Teams include your primary Cardiologist (physician) and Advanced Practice Providers (APPs -  Physician Assistants and Nurse Practitioners) who all work together to provide you with the care you need, when you need it.  You will need a follow up appointment in 12 months  Providers on your designated Care Team:   Nicolasa Ducking, NP Eula Listen, PA-C Cadence Fransico [place], [place]  COVID-19 Vaccine Information can be found at: [email or web address] For questions related to vaccine distribution or appointments, please email [email or web address] or call [phone].

## 2023-07-04 LAB — BASIC METABOLIC PANEL
BUN/Creatinine Ratio: 16 (ref 9–20)
BUN: 23 mg/dL (ref 6–24)
CO2: 24 mmol/L (ref 20–29)
Calcium: 9.8 mg/dL (ref 8.7–10.2)
Chloride: 98 mmol/L (ref 96–106)
Creatinine, Ser: 1.47 mg/dL — ABNORMAL HIGH (ref 0.76–1.27)
Glucose: 155 mg/dL — ABNORMAL HIGH (ref 70–99)
Potassium: 4.3 mmol/L (ref 3.5–5.2)
Sodium: 141 mmol/L (ref 134–144)
eGFR: 57 mL/min/{1.73_m2} — ABNORMAL LOW (ref >59)

## 2023-07-14 ENCOUNTER — Encounter: Payer: Self-pay | Admitting: Emergency Medicine

## 2023-07-18 ENCOUNTER — Telehealth: Payer: Self-pay | Admitting: Cardiovascular Disease

## 2023-07-18 DIAGNOSIS — I214 Non-ST elevation (NSTEMI) myocardial infarction: Secondary | ICD-10-CM

## 2023-07-18 MED ORDER — CARVEDILOL 12.5 MG PO TABS: 12.5000 mg | ORAL_TABLET | Freq: Two times a day (BID) | ORAL | 3 refills | Status: DC

## 2023-07-18 MED ORDER — VALSARTAN-HYDROCHLOROTHIAZIDE 160-12.5 MG PO TABS: 1.0000 | ORAL_TABLET | Freq: Every day | ORAL | 3 refills | Status: DC

## 2023-07-18 NOTE — Telephone Encounter (Signed)
Patient returned call, he said it's okay to leave a detailed message.

## 2023-07-18 NOTE — Telephone Encounter (Signed)
Spoke to patient and informed him of the following recommendations from the provider  BMP lab work Renal function continues to run high Would recommend medication changes as below with repeat BMP in 1 month Carvedilol up to 12.5 twice daily Stop lisinopril and hydrochlorothiazide Change to valsartan/HCTZ 160/12.5 daily Increase water intake Monitor blood pressure on new medication changes after 1 week and call us with numbers  Patient understood with read back

## 2023-09-06 ENCOUNTER — Other Ambulatory Visit: Payer: Self-pay | Admitting: Cardiovascular Disease

## 2023-09-06 MED ORDER — VALSARTAN-HYDROCHLOROTHIAZIDE 160-12.5 MG PO TABS: 1.0000 | ORAL_TABLET | Freq: Every day | ORAL | 3 refills | Status: DC

## 2023-09-06 MED ORDER — CARVEDILOL 12.5 MG PO TABS: 12.5000 mg | ORAL_TABLET | Freq: Two times a day (BID) | ORAL | 3 refills | Status: DC

## 2023-09-06 NOTE — Telephone Encounter (Signed)
*  STAT* If patient is at the pharmacy, call can be transferred to refill team.   1. Which medications need to be refilled? (please list name of each medication and dose if known) new prescriptions for Carvedilol , Valsartan ,  and Atorvastatin ,    2. Would you like to learn more about the convenience, safety, & potential cost savings by using the [facility]?     3. Are you open to using the [facility] (Type [facility]. \.   4. Which pharmacy/location (including street and city if local pharmacy) is medication to be sent to?[facility], [place]   5. Do they need a 30 day or 90 day supply? 90 days an refills-patient says if you need to call him and he does not answer, please leave a detailed message- he works 3rd shift.SABRA

## 2023-09-06 NOTE — Telephone Encounter (Signed)
 Requested Prescriptions   Signed Prescriptions Disp Refills   valsartan -hydrochlorothiazide  (DIOVAN  HCT) 160-12.5 MG tablet 90 tablet 3    Sig: Take 1 tablet by mouth daily.    Authorizing Provider: PERLA EVALENE PARAS    Ordering User: Mignonne Afonso  C   carvedilol  (COREG ) 12.5 MG tablet 180 tablet 3    Sig: Take 1 tablet (12.5 mg total) by mouth 2 (two) times daily.    Authorizing Provider: PERLA EVALENE PARAS    Ordering User: WILFRED, Gaylord Seydel  C

## 2023-09-06 NOTE — Telephone Encounter (Signed)
 Historical Medication Atorvastatin 40 mg every day.

## 2023-09-07 MED ORDER — ATORVASTATIN CALCIUM 40 MG PO TABS: 40.0000 mg | ORAL_TABLET | Freq: Every day | ORAL | 3 refills | Status: DC

## 2023-09-29 DIAGNOSIS — Z712 Person consulting for explanation of examination or test findings: Secondary | ICD-10-CM | POA: Diagnosis not present

## 2023-09-29 DIAGNOSIS — E1165 Type 2 diabetes mellitus with hyperglycemia: Secondary | ICD-10-CM | POA: Diagnosis not present

## 2023-09-29 DIAGNOSIS — Z0131 Encounter for examination of blood pressure with abnormal findings: Secondary | ICD-10-CM | POA: Diagnosis not present

## 2023-09-29 DIAGNOSIS — D229 Melanocytic nevi, unspecified: Secondary | ICD-10-CM | POA: Diagnosis not present

## 2023-09-29 DIAGNOSIS — Z1389 Encounter for screening for other disorder: Secondary | ICD-10-CM | POA: Diagnosis not present

## 2023-09-29 DIAGNOSIS — I25118 Atherosclerotic heart disease of native coronary artery with other forms of angina pectoris: Secondary | ICD-10-CM | POA: Diagnosis not present

## 2023-09-29 DIAGNOSIS — F5221 Male erectile disorder: Secondary | ICD-10-CM | POA: Diagnosis not present

## 2023-09-29 DIAGNOSIS — I10 Essential (primary) hypertension: Secondary | ICD-10-CM | POA: Diagnosis not present

## 2024-02-22 DIAGNOSIS — D2271 Melanocytic nevi of right lower limb, including hip: Secondary | ICD-10-CM | POA: Diagnosis not present

## 2024-02-22 DIAGNOSIS — L820 Inflamed seborrheic keratosis: Secondary | ICD-10-CM | POA: Diagnosis not present

## 2024-02-22 DIAGNOSIS — D2262 Melanocytic nevi of left upper limb, including shoulder: Secondary | ICD-10-CM | POA: Diagnosis not present

## 2024-02-22 DIAGNOSIS — D2261 Melanocytic nevi of right upper limb, including shoulder: Secondary | ICD-10-CM | POA: Diagnosis not present

## 2024-02-22 DIAGNOSIS — R208 Other disturbances of skin sensation: Secondary | ICD-10-CM | POA: Diagnosis not present

## 2024-02-22 DIAGNOSIS — D225 Melanocytic nevi of trunk: Secondary | ICD-10-CM | POA: Diagnosis not present

## 2024-02-22 DIAGNOSIS — L538 Other specified erythematous conditions: Secondary | ICD-10-CM | POA: Diagnosis not present

## 2024-02-22 DIAGNOSIS — L732 Hidradenitis suppurativa: Secondary | ICD-10-CM | POA: Diagnosis not present

## 2024-02-22 DIAGNOSIS — D485 Neoplasm of uncertain behavior of skin: Secondary | ICD-10-CM | POA: Diagnosis not present

## 2024-03-25 DIAGNOSIS — H524 Presbyopia: Secondary | ICD-10-CM | POA: Diagnosis not present

## 2024-03-27 DIAGNOSIS — H5213 Myopia, bilateral: Secondary | ICD-10-CM | POA: Diagnosis not present

## 2024-07-04 DIAGNOSIS — I10 Essential (primary) hypertension: Secondary | ICD-10-CM | POA: Diagnosis not present

## 2024-07-04 DIAGNOSIS — Z0131 Encounter for examination of blood pressure with abnormal findings: Secondary | ICD-10-CM | POA: Diagnosis not present

## 2024-07-04 DIAGNOSIS — I25118 Atherosclerotic heart disease of native coronary artery with other forms of angina pectoris: Secondary | ICD-10-CM | POA: Diagnosis not present

## 2024-07-04 DIAGNOSIS — N1831 Chronic kidney disease, stage 3a: Secondary | ICD-10-CM | POA: Diagnosis not present

## 2024-07-04 DIAGNOSIS — Z712 Person consulting for explanation of examination or test findings: Secondary | ICD-10-CM | POA: Diagnosis not present

## 2024-07-04 DIAGNOSIS — Z1389 Encounter for screening for other disorder: Secondary | ICD-10-CM | POA: Diagnosis not present

## 2024-07-04 DIAGNOSIS — E1165 Type 2 diabetes mellitus with hyperglycemia: Secondary | ICD-10-CM | POA: Diagnosis not present

## 2024-07-04 DIAGNOSIS — E119 Type 2 diabetes mellitus without complications: Secondary | ICD-10-CM | POA: Diagnosis not present

## 2024-08-12 ENCOUNTER — Other Ambulatory Visit: Payer: Self-pay | Admitting: Cardiovascular Disease

## 2024-08-14 ENCOUNTER — Telehealth: Payer: Self-pay | Admitting: Cardiovascular Disease

## 2024-08-14 MED ORDER — VALSARTAN-HYDROCHLOROTHIAZIDE 160-12.5 MG PO TABS: 1.0000 | ORAL_TABLET | Freq: Every day | ORAL | 0 refills | Status: DC

## 2024-08-14 NOTE — Telephone Encounter (Signed)
 Refill sent

## 2024-08-14 NOTE — Telephone Encounter (Signed)
°*  STAT* If patient is at the pharmacy, call can be transferred to refill team.   1. Which medications need to be refilled? (please list name of each medication and dose if known) valsartan -hydrochlorothiazide  (DIOVAN  HCT) 160-12.5 MG tablet    2. Would you like to learn more about the convenience, safety, & potential cost savings by using the [facility]? No    3. Are you open to using the [facility] (Type [facility]. ). No    4. Which pharmacy/location (including street and city if local pharmacy) is medication to be sent to?[facility] - [place], [place] - [address]    5. Do they need a 30 day or 90 day supply? 30 day    Pt is out of medication and has upcoming appt in Jan

## 2024-09-26 NOTE — Progress Notes (Signed)
 Cardiology Office Note  Date:  09/27/2024   ID:  Amonte, [place] May 25, 1971, MRN [id number]  PCP:  Jacques Garre, NP   Chief Complaint  Patient presents with   12 month follow up    Patient denies chest pain or shortness of breath.     HPI:  Mr. Nathaniel Chandler is a 54 year old gentleman with past medical history of Smoker 1 ppd Hypertension, chronic and poorly controlled Hyperlipidemia admitted to [facility] 03/26/18 with SSCP NSTEMI- peak troponin 5.4.   He presents for follow-up of his coronary artery disease, stent to the RCA Who presents for f/u of his hypertension, coronary disease  -Last seen by myself in 11/24 In follow-up today Works third shift at residential detox facility  Denies significant chest pain or shortness of breath on exertion Weight down, after dietary changes, not eating  Continues to smoke,  too much Reports that he stopped drinking many years ago  Lab work reviewed Total chol 130, LDL 39 A1c 7.9 in jan 2025  EKG personally reviewed by myself on todays visit  Cardiac catheterization July 2019  DES to his mid to distal RCA.  NSTEMI/PCI: LM nl, LAD mild prox dzs, RI large/nl, LCX nl, OM1/2 nl, RCA nondom, 90d (2.5x12 Resolute Onyx DES), RPAV 100, EF 55-65%.    PMH:   has a past medical history of CAD (coronary artery disease), Essential hypertension, Hyperlipidemia LDL goal <70, and Tobacco abuse.  PSH:    Past Surgical History:  Procedure Laterality Date   APPENDECTOMY     CORONARY STENT INTERVENTION N/A 03/26/2018   Procedure: CORONARY STENT INTERVENTION;  Surgeon: Ammon Blunt, MD;  Location: [facility] INVASIVE CV LAB;  Service: Cardiovascular;  Laterality: N/A;   LEFT HEART CATH AND CORONARY ANGIOGRAPHY N/A 03/26/2018   Procedure: LEFT HEART CATH AND CORONARY ANGIOGRAPHY poss PCI;  Surgeon: Perla Evalene JINNY, MD;  Location: [facility] INVASIVE CV LAB;  Service: Cardiovascular;  Laterality: N/A;    Current Outpatient Medications  Medication  Sig Dispense Refill   acetaminophen  (TYLENOL ) 325 MG tablet Take 2 tablets (650 mg total) by mouth every 6 (six) hours as needed for mild pain (or Fever >/= 101).     aspirin  81 MG EC tablet Take 1 tablet (81 mg total) by mouth daily. 90 tablet 0   atorvastatin  (LIPITOR ) 40 MG tablet Take 1 tablet (40 mg total) by mouth daily. 90 tablet 3   b complex vitamins capsule Take 1 capsule by mouth daily.     carvedilol  (COREG ) 12.5 MG tablet TAKE 1 TABLET BY MOUTH 2 TIMES A DAY. 60 tablet 0   celecoxib (CELEBREX) 100 MG capsule Take 100 mg by mouth daily.     JARDIANCE 10 MG TABS tablet Take 10 mg by mouth daily.     metFORMIN (GLUCOPHAGE) 1000 MG tablet Take 1,000 mg by mouth 2 (two) times daily.     Multiple Vitamin (MULTIVITAMIN) tablet Take 1 tablet by mouth daily.     valsartan -hydrochlorothiazide  (DIOVAN  HCT) 160-12.5 MG tablet Take 1 tablet by mouth daily. 90 tablet 0   VIAGRA 100 MG tablet VIAGRA 100 MG TABS     Vitamin D-Vitamin K (VITAMIN K2-VITAMIN D3 PO) Take by mouth daily.     No current facility-administered medications for this visit.    Allergies:   Sulfa antibiotics and Latex   Social History:  The patient  reports that he has been smoking cigarettes. He started smoking about 31 years ago. He has a 32 pack-year smoking history. He  has never used smokeless tobacco. He reports current drug use. Drug: Marijuana. He reports that he does not drink alcohol.   Family History:   family history includes CAD in his brother; Congestive Heart Failure in his father and mother; Diabetes Mellitus II in his father; Heart attack in his mother; Heart disease in his mother; Hypertension in his father.    Review of Systems: Review of Systems  Constitutional: Negative.   HENT: Negative.    Respiratory: Negative.    Cardiovascular: Negative.   Gastrointestinal: Negative.   Musculoskeletal: Negative.   Neurological: Negative.   Psychiatric/Behavioral: Negative.    All other systems reviewed and  are negative.   PHYSICAL EXAM: VS:  BP 130/80 (BP Location: Left Arm, Patient Position: Sitting, Cuff Size: Large)   Pulse 70   Ht 6' 2 (1.88 m)   Wt 254 lb 2 oz (115.3 kg)   SpO2 99%   BMI 32.63 kg/m  , BMI Body mass index is 32.63 kg/m. Constitutional:  oriented to person, place, and time. No distress.  HENT:  Head: Grossly normal Eyes:  no discharge. No scleral icterus.  Neck: No JVD, no carotid bruits  Cardiovascular: Regular rate and rhythm, no murmurs appreciated Pulmonary/Chest: Clear to auscultation bilaterally, no wheezes or rails Abdominal: Soft.  no distension.  no tenderness.  Musculoskeletal: Normal range of motion Neurological:  normal muscle tone. Coordination normal. No atrophy Skin: Skin warm and dry Psychiatric: normal affect, pleasant  Recent Labs: No results found for requested labs within last 365 days.    Lipid Panel Lab Results  Component Value Date   CHOL 124 03/27/2018   HDL 31 (L) 03/27/2018   LDLCALC 72 03/27/2018   TRIG 104 03/27/2018      Wt Readings from Last 3 Encounters:  09/27/24 254 lb 2 oz (115.3 kg)  07/03/23 285 lb 8 oz (129.5 kg)  11/01/19 290 lb 8 oz (131.8 kg)     ASSESSMENT AND PLAN:  Problem List Items Addressed This Visit       Cardiology Problems   Atherosclerosis of native coronary artery of native heart with stable angina pectoris - Primary   Relevant Medications   VIAGRA 100 MG tablet   Other Relevant Orders   EKG 12-Lead   Uncontrolled hypertension   Relevant Medications   VIAGRA 100 MG tablet   Other Relevant Orders   EKG 12-Lead   CAD S/P percutaneous coronary angioplasty   Relevant Medications   VIAGRA 100 MG tablet   Other Relevant Orders   EKG 12-Lead   Dyslipidemia, goal LDL below 70   Relevant Medications   VIAGRA 100 MG tablet   NSVT (nonsustained ventricular tachycardia) (HCC)   Relevant Medications   VIAGRA 100 MG tablet   Other Relevant Orders   EKG 12-Lead     Other   Smoker     Coronary artery disease with stable angina Currently with no symptoms of angina. No further workup at this time. Continue current medication regimen. Smoking cessation recommended  Hyperlipidemia Cholesterol is at goal on the current lipid regimen. No changes to the medications were made.  Essential hypertension Recommend he change valsartan  HCTZ to valsartan  alone given renal dysfunction, no indication of fluid retention He has had recent significant weight loss, blood pressure improving  Chronic pain/back Improved symptoms on Celebrex  Chronic renal insufficiency In the setting of diabetes creatinine 1.6 Recommend he hold HCTZ  Diabetes type 2 with complications Managed by primary care, weight trending downward which should  help numbers   Signed, Velinda Lunger, M.D., Ph.D. [facility] [place], [place] [phone]

## 2024-09-27 ENCOUNTER — Ambulatory Visit: Attending: Cardiovascular Disease | Admitting: Cardiovascular Disease

## 2024-09-27 ENCOUNTER — Encounter: Payer: Self-pay | Admitting: Cardiovascular Disease

## 2024-09-27 VITALS — BP 130/80 | HR 70 | Ht 74.0 in | Wt 254.1 lb

## 2024-09-27 DIAGNOSIS — F17200 Nicotine dependence, unspecified, uncomplicated: Secondary | ICD-10-CM | POA: Diagnosis present

## 2024-09-27 DIAGNOSIS — I25118 Atherosclerotic heart disease of native coronary artery with other forms of angina pectoris: Secondary | ICD-10-CM | POA: Diagnosis present

## 2024-09-27 DIAGNOSIS — E785 Hyperlipidemia, unspecified: Secondary | ICD-10-CM | POA: Insufficient documentation

## 2024-09-27 DIAGNOSIS — Z9861 Coronary angioplasty status: Secondary | ICD-10-CM | POA: Insufficient documentation

## 2024-09-27 DIAGNOSIS — I4729 Other ventricular tachycardia: Secondary | ICD-10-CM | POA: Diagnosis present

## 2024-09-27 DIAGNOSIS — I2510 Atherosclerotic heart disease of native coronary artery without angina pectoris: Secondary | ICD-10-CM | POA: Insufficient documentation

## 2024-09-27 DIAGNOSIS — I10 Essential (primary) hypertension: Secondary | ICD-10-CM | POA: Insufficient documentation

## 2024-09-27 MED ORDER — CARVEDILOL 12.5 MG PO TABS: 12.5000 mg | ORAL_TABLET | Freq: Two times a day (BID) | ORAL | 3 refills | Status: AC

## 2024-09-27 MED ORDER — VALSARTAN 160 MG PO TABS: 160.0000 mg | ORAL_TABLET | Freq: Every day | ORAL | 3 refills | Status: AC

## 2024-09-27 MED ORDER — ATORVASTATIN CALCIUM 40 MG PO TABS: 40.0000 mg | ORAL_TABLET | Freq: Every day | ORAL | 3 refills | Status: AC

## 2024-09-27 NOTE — Patient Instructions (Addendum)
 Medication Instructions:   Please change valsartan -hydrochlorothiazide  to valsartan  alone  If you need a refill on your cardiac medications before your next appointment, please call your pharmacy.   Lab work: No new labs needed  Testing/Procedures: No new testing needed  Follow-Up: At [facility], you and your health needs are our priority.  As part of our continuing mission to provide you with exceptional heart care, we have created designated Provider Care Teams.  These Care Teams include your primary Cardiologist (physician) and Advanced Practice Providers (APPs -  Physician Assistants and Nurse Practitioners) who all work together to provide you with the care you need, when you need it.  You will need a follow up appointment in 12 months  Providers on your designated Care Team:   Lonni Meager, NP Bernardino Bring, PA-C Cadence [place], [place]  COVID-19 Vaccine Information can be found at: [email or web address] For questions related to vaccine distribution or appointments, please email [email or web address] or call [phone].
